# Patient Record
Sex: Female | Born: 1980 | Hispanic: Yes | Marital: Single | Smoking: Never smoker
Health system: Southern US, Community
[De-identification: ages and names within clinical notes are randomized; demographics above are authoritative.]

## PROBLEM LIST (undated history)

## (undated) ENCOUNTER — Inpatient Hospital Stay (HOSPITAL_COMMUNITY): Payer: Self-pay

## (undated) DIAGNOSIS — O24419 Gestational diabetes mellitus in pregnancy, unspecified control: Secondary | ICD-10-CM

## (undated) DIAGNOSIS — E039 Hypothyroidism, unspecified: Secondary | ICD-10-CM

## (undated) DIAGNOSIS — N189 Chronic kidney disease, unspecified: Secondary | ICD-10-CM

## (undated) DIAGNOSIS — O34219 Maternal care for unspecified type scar from previous cesarean delivery: Secondary | ICD-10-CM

## (undated) HISTORY — PX: OTHER SURGICAL HISTORY: SHX169

## (undated) HISTORY — DX: Maternal care for unspecified type scar from previous cesarean delivery: O34.219

---

## 2009-08-05 ENCOUNTER — Emergency Department (HOSPITAL_COMMUNITY): Admission: EM | Admit: 2009-08-05 | Discharge: 2009-08-05 | Payer: Self-pay | Admitting: Emergency Medicine

## 2009-08-08 ENCOUNTER — Ambulatory Visit: Payer: Self-pay | Admitting: Nurse Practitioner

## 2009-08-08 ENCOUNTER — Inpatient Hospital Stay (HOSPITAL_COMMUNITY): Admission: AD | Admit: 2009-08-08 | Discharge: 2009-08-08 | Payer: Self-pay | Admitting: Obstetrics & Gynecology

## 2009-08-23 ENCOUNTER — Ambulatory Visit: Payer: Self-pay | Admitting: Obstetrics and Gynecology

## 2009-08-28 ENCOUNTER — Ambulatory Visit (HOSPITAL_COMMUNITY): Admission: RE | Admit: 2009-08-28 | Discharge: 2009-08-28 | Payer: Self-pay | Admitting: Family Medicine

## 2010-03-10 ENCOUNTER — Encounter: Payer: Self-pay | Admitting: *Deleted

## 2010-04-30 ENCOUNTER — Other Ambulatory Visit: Payer: Self-pay | Admitting: Internal Medicine

## 2010-04-30 ENCOUNTER — Ambulatory Visit (HOSPITAL_COMMUNITY)
Admission: RE | Admit: 2010-04-30 | Discharge: 2010-04-30 | Disposition: A | Discharge status: Home / Self Care | Payer: Self-pay | Source: Ambulatory Visit | Attending: Internal Medicine | Admitting: Internal Medicine

## 2010-04-30 DIAGNOSIS — Z3201 Encounter for pregnancy test, result positive: Secondary | ICD-10-CM

## 2010-04-30 DIAGNOSIS — Z3689 Encounter for other specified antenatal screening: Secondary | ICD-10-CM | POA: Insufficient documentation

## 2010-04-30 DIAGNOSIS — O99891 Other specified diseases and conditions complicating pregnancy: Secondary | ICD-10-CM | POA: Insufficient documentation

## 2010-04-30 DIAGNOSIS — R102 Pelvic and perineal pain: Secondary | ICD-10-CM

## 2010-04-30 DIAGNOSIS — R1032 Left lower quadrant pain: Secondary | ICD-10-CM | POA: Insufficient documentation

## 2010-05-05 LAB — COMPREHENSIVE METABOLIC PANEL
ALT: 12 U/L (ref 0–35)
AST: 16 U/L (ref 0–37)
Albumin: 3.7 g/dL (ref 3.5–5.2)
Alkaline Phosphatase: 46 U/L (ref 39–117)
CO2: 23 mEq/L (ref 19–32)
Calcium: 9.1 mg/dL (ref 8.4–10.5)
Chloride: 110 mEq/L (ref 96–112)
GFR calc Af Amer: >60 mL/min (ref >60)
Glucose, Bld: 84 mg/dL (ref 70–99)
Total Bilirubin: 0.7 mg/dL (ref 0.3–1.2)
Total Protein: 6.7 g/dL (ref 6.0–8.3)

## 2010-05-05 LAB — POCT I-STAT, CHEM 8
BUN: 9 mg/dL (ref 6–23)
Chloride: 106 mEq/L (ref 96–112)
Glucose, Bld: 82 mg/dL (ref 70–99)
HCT: 41 % (ref 36.0–46.0)
Hemoglobin: 13.9 g/dL (ref 12.0–15.0)
Potassium: 3.7 mEq/L (ref 3.5–5.1)

## 2010-05-05 LAB — DIFFERENTIAL
Basophils Relative: 1 % (ref 0–1)
Eosinophils Relative: 1 % (ref 0–5)
Lymphocytes Relative: 32 % (ref 12–46)
Lymphs Abs: 1.6 10*3/uL (ref 0.7–4.0)
Neutrophils Relative %: 59 % (ref 43–77)

## 2010-05-05 LAB — CBC
Hemoglobin: 13.5 g/dL (ref 12.0–15.0)
MCHC: 34.6 g/dL (ref 30.0–36.0)
RDW: 14 % (ref 11.5–15.5)

## 2010-05-05 LAB — URINALYSIS, ROUTINE W REFLEX MICROSCOPIC
Glucose, UA: NEGATIVE mg/dL
Specific Gravity, Urine: 1.016 (ref 1.005–1.030)

## 2010-05-05 LAB — WET PREP, GENITAL
Trich, Wet Prep: NONE SEEN
Yeast Wet Prep HPF POC: NONE SEEN

## 2010-05-05 LAB — GC/CHLAMYDIA PROBE AMP, GENITAL: GC Probe Amp, Genital: NEGATIVE

## 2010-06-04 LAB — GC/CHLAMYDIA PROBE AMP, GENITAL
Chlamydia: NEGATIVE
Gonorrhea: NEGATIVE

## 2010-06-04 LAB — CBC
HCT: 39 % (ref 36–46)
Hemoglobin: 13.5 g/dL (ref 12.0–16.0)

## 2010-06-04 LAB — HIV ANTIBODY (ROUTINE TESTING W REFLEX): HIV: NONREACTIVE

## 2010-06-04 LAB — RPR: RPR: NONREACTIVE

## 2010-11-22 LAB — STREP B DNA PROBE: GBS: NEGATIVE

## 2010-12-23 ENCOUNTER — Other Ambulatory Visit: Payer: Self-pay | Admitting: Obstetrics

## 2010-12-23 ENCOUNTER — Encounter (HOSPITAL_COMMUNITY): Payer: Self-pay | Admitting: Pediatric Intensive Care

## 2010-12-23 ENCOUNTER — Encounter (HOSPITAL_COMMUNITY): Payer: Self-pay | Admitting: Anesthesiology

## 2010-12-23 ENCOUNTER — Encounter (HOSPITAL_COMMUNITY): Payer: Self-pay | Admitting: *Deleted

## 2010-12-23 ENCOUNTER — Inpatient Hospital Stay (HOSPITAL_COMMUNITY)
Admission: AD | Admit: 2010-12-23 | Discharge: 2010-12-26 | DRG: 766 | Disposition: A | Discharge status: Home / Self Care | Payer: Medicaid Other | Source: Ambulatory Visit | Attending: Obstetrics & Gynecology | Admitting: Obstetrics & Gynecology

## 2010-12-23 ENCOUNTER — Inpatient Hospital Stay (HOSPITAL_COMMUNITY): Payer: Medicaid Other | Admitting: Anesthesiology

## 2010-12-23 ENCOUNTER — Encounter (HOSPITAL_COMMUNITY)
Admission: AD | Disposition: A | Discharge status: Home / Self Care | Payer: Self-pay | Source: Ambulatory Visit | Attending: Obstetrics & Gynecology

## 2010-12-23 DIAGNOSIS — E079 Disorder of thyroid, unspecified: Secondary | ICD-10-CM | POA: Diagnosis present

## 2010-12-23 DIAGNOSIS — E039 Hypothyroidism, unspecified: Secondary | ICD-10-CM | POA: Diagnosis present

## 2010-12-23 DIAGNOSIS — O99280 Endocrine, nutritional and metabolic diseases complicating pregnancy, unspecified trimester: Secondary | ICD-10-CM | POA: Diagnosis present

## 2010-12-23 DIAGNOSIS — IMO0002 Reserved for concepts with insufficient information to code with codable children: Secondary | ICD-10-CM | POA: Clinically undetermined

## 2010-12-23 DIAGNOSIS — O4700 False labor before 37 completed weeks of gestation, unspecified trimester: Secondary | ICD-10-CM | POA: Diagnosis present

## 2010-12-23 DIAGNOSIS — O34219 Maternal care for unspecified type scar from previous cesarean delivery: Secondary | ICD-10-CM | POA: Diagnosis not present

## 2010-12-23 HISTORY — DX: Hypothyroidism, unspecified: E03.9

## 2010-12-23 HISTORY — DX: Chronic kidney disease, unspecified: N18.9

## 2010-12-23 LAB — CBC
HCT: 31.2 % — ABNORMAL LOW (ref 36.0–46.0)
Hemoglobin: 10.5 g/dL — ABNORMAL LOW (ref 12.0–15.0)
RBC: 3.89 MIL/uL (ref 3.87–5.11)
RDW: 14.2 % (ref 11.5–15.5)
WBC: 9 10*3/uL (ref 4.0–10.5)

## 2010-12-23 SURGERY — Surgical Case
Anesthesia: Epidural | Site: Abdomen | Wound class: Clean Contaminated

## 2010-12-23 MED ORDER — OXYTOCIN 20 UNITS IN LACTATED RINGERS INFUSION - SIMPLE: 125.0000 mL/h | Freq: Once | INTRAVENOUS | Status: DC

## 2010-12-23 MED ORDER — ACETAMINOPHEN 325 MG PO TABS: 325.0000 mg | ORAL_TABLET | ORAL | Status: DC | PRN

## 2010-12-23 MED ORDER — LACTATED RINGERS IV SOLN
INTRAVENOUS | Status: DC | PRN
  Administered 2010-12-23: via INTRAVENOUS

## 2010-12-23 MED ORDER — KETOROLAC TROMETHAMINE 30 MG/ML IJ SOLN
INTRAMUSCULAR | Status: AC
  Administered 2010-12-24: 30 mg via INTRAVENOUS
  Filled 2010-12-23: qty 1

## 2010-12-23 MED ORDER — MORPHINE SULFATE (PF) 0.5 MG/ML IJ SOLN
INTRAMUSCULAR | Status: DC | PRN
  Administered 2010-12-23: 1 mg via INTRAVENOUS

## 2010-12-23 MED ORDER — FENTANYL 2.5 MCG/ML BUPIVACAINE 1/10 % EPIDURAL INFUSION (WH - ANES)
INTRAMUSCULAR | Status: DC | PRN
  Administered 2010-12-23: 13 mL/h via EPIDURAL

## 2010-12-23 MED ORDER — FENTANYL 2.5 MCG/ML BUPIVACAINE 1/10 % EPIDURAL INFUSION (WH - ANES)
14.0000 mL/h | INTRAMUSCULAR | Status: DC
  Administered 2010-12-23: 14 mL/h via EPIDURAL
  Filled 2010-12-23 (×2): qty 60

## 2010-12-23 MED ORDER — CEFAZOLIN SODIUM 1-5 GM-% IV SOLN
INTRAVENOUS | Status: DC | PRN
  Administered 2010-12-23: 1 g via INTRAVENOUS

## 2010-12-23 MED ORDER — TERBUTALINE SULFATE 1 MG/ML IJ SOLN
INTRAMUSCULAR | Status: AC
  Filled 2010-12-23: qty 1

## 2010-12-23 MED ORDER — LIDOCAINE-EPINEPHRINE (PF) 2 %-1:200000 IJ SOLN
INTRAMUSCULAR | Status: AC
  Filled 2010-12-23: qty 20

## 2010-12-23 MED ORDER — FLEET ENEMA 7-19 GM/118ML RE ENEM: 1.0000 | ENEMA | RECTAL | Status: DC | PRN

## 2010-12-23 MED ORDER — LACTATED RINGERS IV SOLN: 500.0000 mL | INTRAVENOUS | Status: DC | PRN

## 2010-12-23 MED ORDER — LACTATED RINGERS IV SOLN: INTRAVENOUS | Status: DC

## 2010-12-23 MED ORDER — PHENYLEPHRINE 40 MCG/ML (10ML) SYRINGE FOR IV PUSH (FOR BLOOD PRESSURE SUPPORT)
80.0000 ug | PREFILLED_SYRINGE | INTRAVENOUS | Status: DC | PRN
  Filled 2010-12-23: qty 5

## 2010-12-23 MED ORDER — MEPERIDINE HCL 25 MG/ML IJ SOLN
INTRAMUSCULAR | Status: AC
  Filled 2010-12-23: qty 1

## 2010-12-23 MED ORDER — ONDANSETRON HCL 4 MG/2ML IJ SOLN
INTRAMUSCULAR | Status: AC
  Filled 2010-12-23: qty 2

## 2010-12-23 MED ORDER — ONDANSETRON HCL 4 MG/2ML IJ SOLN
INTRAMUSCULAR | Status: DC | PRN
  Administered 2010-12-23: 4 mg via INTRAVENOUS

## 2010-12-23 MED ORDER — MEPERIDINE HCL 25 MG/ML IJ SOLN: 6.2500 mg | INTRAMUSCULAR | Status: DC | PRN

## 2010-12-23 MED ORDER — MEPERIDINE HCL 25 MG/ML IJ SOLN
INTRAMUSCULAR | Status: DC | PRN
  Administered 2010-12-23: 6 mg via INTRAVENOUS
  Administered 2010-12-23: 7 mg via INTRAVENOUS
  Administered 2010-12-23 (×2): 6 mg via INTRAVENOUS

## 2010-12-23 MED ORDER — SODIUM BICARBONATE 8.4 % IV SOLN
INTRAVENOUS | Status: DC | PRN
  Administered 2010-12-23: 5 mL via EPIDURAL

## 2010-12-23 MED ORDER — OXYCODONE-ACETAMINOPHEN 5-325 MG PO TABS
2.0000 | ORAL_TABLET | ORAL | Status: DC | PRN
  Filled 2010-12-23: qty 1

## 2010-12-23 MED ORDER — LACTATED RINGERS IV SOLN
500.0000 mL | Freq: Once | INTRAVENOUS | Status: AC
  Administered 2010-12-23: 1000 mL via INTRAVENOUS

## 2010-12-23 MED ORDER — IBUPROFEN 600 MG PO TABS: 600.0000 mg | ORAL_TABLET | Freq: Four times a day (QID) | ORAL | Status: DC | PRN

## 2010-12-23 MED ORDER — EPHEDRINE 5 MG/ML INJ
10.0000 mg | INTRAVENOUS | Status: DC | PRN
  Filled 2010-12-23 (×2): qty 4

## 2010-12-23 MED ORDER — KETOROLAC TROMETHAMINE 30 MG/ML IJ SOLN: 15.0000 mg | Freq: Once | INTRAMUSCULAR | Status: AC | PRN

## 2010-12-23 MED ORDER — DIPHENHYDRAMINE HCL 50 MG/ML IJ SOLN: 12.5000 mg | INTRAMUSCULAR | Status: DC | PRN

## 2010-12-23 MED ORDER — LACTATED RINGERS IV SOLN
INTRAVENOUS | Status: DC
  Administered 2010-12-23 (×2): via INTRAVENOUS
  Administered 2010-12-23 (×2): 125 mL/h via INTRAVENOUS

## 2010-12-23 MED ORDER — LEVOTHYROXINE SODIUM 50 MCG PO TABS
50.0000 ug | ORAL_TABLET | Freq: Every day | ORAL | Status: DC
  Administered 2010-12-23: 50 ug via ORAL
  Filled 2010-12-23 (×2): qty 1

## 2010-12-23 MED ORDER — EPHEDRINE 5 MG/ML INJ
10.0000 mg | INTRAVENOUS | Status: DC | PRN
  Filled 2010-12-23: qty 4

## 2010-12-23 MED ORDER — OXYTOCIN 20 UNITS IN LACTATED RINGERS INFUSION - SIMPLE
INTRAVENOUS | Status: DC | PRN
  Administered 2010-12-23: 20 [IU] via INTRAVENOUS

## 2010-12-23 MED ORDER — OXYTOCIN 10 UNIT/ML IJ SOLN
INTRAMUSCULAR | Status: AC
  Filled 2010-12-23: qty 2

## 2010-12-23 MED ORDER — MORPHINE SULFATE 0.5 MG/ML IJ SOLN
INTRAMUSCULAR | Status: AC
  Filled 2010-12-23: qty 10

## 2010-12-23 MED ORDER — LIDOCAINE HCL 1.5 % IJ SOLN
INTRAMUSCULAR | Status: DC | PRN
  Administered 2010-12-23: 3 mL via EPIDURAL
  Administered 2010-12-23: 4 mL via INTRADERMAL

## 2010-12-23 MED ORDER — CITRIC ACID-SODIUM CITRATE 334-500 MG/5ML PO SOLN
30.0000 mL | ORAL | Status: DC | PRN
  Administered 2010-12-23: 30 mL via ORAL
  Filled 2010-12-23 (×2): qty 15

## 2010-12-23 MED ORDER — PHENYLEPHRINE 40 MCG/ML (10ML) SYRINGE FOR IV PUSH (FOR BLOOD PRESSURE SUPPORT)
80.0000 ug | PREFILLED_SYRINGE | INTRAVENOUS | Status: DC | PRN
  Filled 2010-12-23 (×2): qty 5

## 2010-12-23 MED ORDER — BUTORPHANOL TARTRATE 2 MG/ML IJ SOLN
1.0000 mg | INTRAMUSCULAR | Status: DC | PRN
  Administered 2010-12-23 (×2): 1 mg via INTRAVENOUS
  Filled 2010-12-23 (×3): qty 1

## 2010-12-23 MED ORDER — CEFAZOLIN SODIUM 1-5 GM-% IV SOLN
INTRAVENOUS | Status: AC
  Filled 2010-12-23: qty 50

## 2010-12-23 MED ORDER — ACETAMINOPHEN 325 MG PO TABS: 650.0000 mg | ORAL_TABLET | ORAL | Status: DC | PRN

## 2010-12-23 MED ORDER — MISOPROSTOL 25 MCG QUARTER TABLET
75.0000 ug | ORAL_TABLET | ORAL | Status: AC
  Administered 2010-12-23: 75 ug via ORAL
  Filled 2010-12-23: qty 0.25
  Filled 2010-12-23: qty 0.75

## 2010-12-23 MED ORDER — ONDANSETRON HCL 4 MG/2ML IJ SOLN: 4.0000 mg | Freq: Four times a day (QID) | INTRAMUSCULAR | Status: DC | PRN

## 2010-12-23 MED ORDER — PROMETHAZINE HCL 25 MG/ML IJ SOLN: 6.2500 mg | INTRAMUSCULAR | Status: DC | PRN

## 2010-12-23 MED ORDER — MORPHINE SULFATE (PF) 0.5 MG/ML IJ SOLN
INTRAMUSCULAR | Status: DC | PRN
  Administered 2010-12-23: 4 mg via EPIDURAL

## 2010-12-23 MED ORDER — SODIUM BICARBONATE 8.4 % IV SOLN
INTRAVENOUS | Status: AC
  Filled 2010-12-23: qty 50

## 2010-12-23 MED ORDER — OXYTOCIN BOLUS FROM INFUSION
500.0000 mL | Freq: Once | INTRAVENOUS | Status: DC
  Filled 2010-12-23: qty 1000
  Filled 2010-12-23: qty 500

## 2010-12-23 MED ORDER — FENTANYL CITRATE 0.05 MG/ML IJ SOLN
25.0000 ug | INTRAMUSCULAR | Status: DC | PRN
  Administered 2010-12-24 (×2): 50 ug via INTRAVENOUS

## 2010-12-23 MED ORDER — LIDOCAINE HCL (PF) 1 % IJ SOLN
30.0000 mL | INTRAMUSCULAR | Status: DC | PRN
  Filled 2010-12-23 (×2): qty 30

## 2010-12-23 SURGICAL SUPPLY — 32 items
CHLORAPREP W/TINT 26ML (MISCELLANEOUS) ×2 IMPLANT
CLOTH BEACON ORANGE TIMEOUT ST (SAFETY) ×2 IMPLANT
CONTAINER PREFILL 10% NBF 15ML (MISCELLANEOUS) ×4 IMPLANT
DERMABOND ADVANCED (GAUZE/BANDAGES/DRESSINGS) ×1
DERMABOND ADVANCED .7 DNX12 (GAUZE/BANDAGES/DRESSINGS) ×1 IMPLANT
DRESSING TELFA 8X3 (GAUZE/BANDAGES/DRESSINGS) IMPLANT
DRSG COVADERM 4X8 (GAUZE/BANDAGES/DRESSINGS) ×2 IMPLANT
ELECT REM PT RETURN 9FT ADLT (ELECTROSURGICAL) ×2
ELECTRODE REM PT RTRN 9FT ADLT (ELECTROSURGICAL) ×1 IMPLANT
EXTRACTOR VACUUM M CUP 4 TUBE (SUCTIONS) IMPLANT
GAUZE SPONGE 4X4 12PLY STRL LF (GAUZE/BANDAGES/DRESSINGS) ×4 IMPLANT
GLOVE BIO SURGEON STRL SZ8.5 (GLOVE) ×4 IMPLANT
GOWN PREVENTION PLUS LG XLONG (DISPOSABLE) ×4 IMPLANT
GOWN PREVENTION PLUS XXLARGE (GOWN DISPOSABLE) ×2 IMPLANT
KIT ABG SYR 3ML LUER SLIP (SYRINGE) IMPLANT
NEEDLE HYPO 25X5/8 SAFETYGLIDE (NEEDLE) ×2 IMPLANT
NS IRRIG 1000ML POUR BTL (IV SOLUTION) ×2 IMPLANT
PACK C SECTION WH (CUSTOM PROCEDURE TRAY) ×2 IMPLANT
PAD ABD 7.5X8 STRL (GAUZE/BANDAGES/DRESSINGS) IMPLANT
SLEEVE SCD COMPRESS KNEE MED (MISCELLANEOUS) IMPLANT
SUT CHROMIC 0 CT 802H (SUTURE) ×2 IMPLANT
SUT CHROMIC 1 CTX 36 (SUTURE) ×6 IMPLANT
SUT CHROMIC 2 0 SH (SUTURE) ×2 IMPLANT
SUT GUT PLAIN 0 CT-3 TAN 27 (SUTURE) ×2 IMPLANT
SUT MON AB 4-0 PS1 27 (SUTURE) ×2 IMPLANT
SUT VIC AB 0 CT1 18XCR BRD8 (SUTURE) IMPLANT
SUT VIC AB 0 CT1 8-18 (SUTURE)
SUT VIC AB 0 CTX 36 (SUTURE) ×2
SUT VIC AB 0 CTX36XBRD ANBCTRL (SUTURE) ×2 IMPLANT
TOWEL OR 17X24 6PK STRL BLUE (TOWEL DISPOSABLE) ×4 IMPLANT
TRAY FOLEY CATH 14FR (SET/KITS/TRAYS/PACK) ×2 IMPLANT
WATER STERILE IRR 1000ML POUR (IV SOLUTION) ×2 IMPLANT

## 2010-12-23 NOTE — Progress Notes (Signed)
Started yesterday evening. Getting closer and stronger, q 6-35min.  No bleeding or water leaking.

## 2010-12-23 NOTE — Anesthesia Procedure Notes (Signed)
Epidural Patient location during procedure: OB Start time: 12/23/2010 6:26 PM  Staffing Anesthesiologist: Demarrion Meiklejohn A. Performed by: anesthesiologist   Preanesthetic Checklist Completed: patient identified, site marked, surgical consent, pre-op evaluation, timeout performed, IV checked, risks and benefits discussed and monitors and equipment checked  Epidural Patient position: sitting Prep: site prepped and draped and DuraPrep Patient monitoring: continuous pulse ox and blood pressure Approach: midline Injection technique: LOR air  Needle:  Needle type: Tuohy  Needle gauge: 17 G Needle length: 9 cm Needle insertion depth: 5 cm cm Catheter type: closed end flexible Catheter size: 19 Gauge Catheter at skin depth: 10 cm Test dose: negative and 1.5% lidocaine  Assessment Events: blood not aspirated, injection not painful, no injection resistance, negative IV test and no paresthesia  Additional Notes Patient is more comfortable after epidural dosed. Please see RN's note for documentation of vital signs and FHR which are stable.

## 2010-12-23 NOTE — Progress Notes (Signed)
[facility] Department of Clinical Social Work Documentation of Interpretation   I assisted ___Anita RN ________________ with interpretation of __questions and plan for pain control____________________ for this patient.

## 2010-12-23 NOTE — Progress Notes (Signed)
Spanish interpreter at bedside, explained plan of care with pt and FOB, questions answered, pt and FOB verbalized understanding.

## 2010-12-23 NOTE — Progress Notes (Signed)
MD made aware of pt status. Duration of pushing time, station, SVE, FHT. Will be in to evaluate pt.

## 2010-12-23 NOTE — Op Note (Signed)
Preop diagnosis failure to progress in labor nonreassuring fetal heart rate tracing postop diagnosis is same Surgeon Dr. Gaynell Face  Patient placed on the operating table in the supine position after the epidural was dosed will abdomen prepped and draped in the bladder emptied with a Foley catheter a Transverse suprapubic incision made carried down to the rectus fascia fascia cleaned and incised the length of the incision recti muscles retracted laterally peritoneum incised longitudinally transverse incision made on the visceroperitoneum above the bladder and the bladder mobilized inferiorly A transverse low uterine incision made in the patient delivered from the OP position of a female Apgar 89 weighing 6 lbs. 7 oz. The placenta was present posterior removed manually and sent to pathology uterine cavity clean and dry laps Uterine incision closed in one layer with continuous within normal on chromic Bladder flap reattached to a chromic Lap and sponge counts correct abdomen closed in layers peritoneum continuous with 2-0 chromic Fascia closed with continuous within of 0 Dexon and the skin shows a subcuticular stitch of 4-0 Monocryl blood loss was 600 cc patient tolerated the procedure well and the dictation dictated by Dr. Gaynell Face

## 2010-12-23 NOTE — Progress Notes (Signed)
Csection planned. POC discussed by Dr Gaynell Face

## 2010-12-23 NOTE — Progress Notes (Signed)
[facility] Department of Clinical Social Work Documentation of Interpretation   I assisted _Susan RN__________________ with interpretation of ___questions___________________ for this patient.

## 2010-12-23 NOTE — Progress Notes (Signed)
Pt delivered viable female with APGARS 8, 9 primary cesarean section by DR Gaynell Face.

## 2010-12-23 NOTE — Progress Notes (Signed)
Joanne [name] is a 30 y.o. G1P0 at [redacted]w[redacted]d by LMP admitted for active labor  Subjective: Uncomfortable  Objective: BP 111/52  Pulse 61  Temp(Src) 97.9 F (36.6 C) (Axillary)  Resp 20  Ht 5' 0.5" (1.537 m)  Wt 63.504 kg (140 lb)  BMI 26.89 kg/m2      FHT:  FHR: 140 bpm, variability: moderate,  accelerations:  Present,  decelerations:  Absent UC:   irregular, every 4 minutes SVE:   Dilation: 4 Effacement (%): 90 Station: 0 Exam by:: Jackson-Moore AROM, IUPC placed  Labs: Lab Results  Component Value Date   WBC 9.0 12/23/2010   HGB 10.5* 12/23/2010   HCT 31.2* 12/23/2010   MCV 80.2 12/23/2010   PLT 258 12/23/2010    Assessment / Plan: Latent labor  Labor: augment with oral cytotec Preeclampsia:  N/A Fetal Wellbeing:  Category I Pain Control:  Labor support without medications I/D:  n/a Anticipated MOD:  NSVD  JACKSON-MOORE,Chanan Detwiler A 12/23/2010, 3:24 PM

## 2010-12-23 NOTE — H&P (Signed)
Joanne [name] is [name] 30 y.o. female presenting for contractions. Maternal Medical History:  Reason for admission: Reason for admission: contractions.  Contractions: Frequency: regular.   Perceived severity is strong.    Fetal activity: Perceived fetal activity is normal.    Prenatal complications: Hypothyroidism    OB History    Grav Para Term Preterm Abortions TAB SAB Ect Mult Living   1              Past Medical History  Diagnosis Date  . Chronic kidney disease     Pt has only one kidney, pt. donated [name] kidney. Pt. not having any problems  . Hypothyroid    Past Surgical History  Procedure Date  . Donated kidney   . Tonsillectomy    Family History: family history is not on file. Social History:  reports that she has never smoked. She has never used smokeless tobacco. She reports that she does not drink alcohol or use illicit drugs.  Review of Systems  Constitutional: Negative for fever.  Eyes: Negative for blurred vision.  Respiratory: Negative for shortness of breath.   Gastrointestinal: Negative for vomiting.  Skin: Negative for rash.  Neurological: Negative for headaches.    Dilation: 3 Effacement (%): 80 Station: -1 Exam by:: S. Carrera, RNC Blood pressure 89/66, pulse 67, temperature 97.9 F (36.6 C), temperature source Axillary, resp. rate 20, height 5' 0.5" (1.537 m), weight 63.504 kg (140 lb). Maternal Exam:  Uterine Assessment: Contraction strength is firm.  Contraction frequency is regular.   Abdomen: Fetal presentation: vertex  Introitus: not evaluated.     Fetal Exam Fetal Monitor Review: Variability: moderate (6-25 bpm).   Pattern: accelerations present and no decelerations.    Fetal State Assessment: Category I - tracings are normal.     Physical Exam  Constitutional: She appears well-developed.  HENT:  Head: Normocephalic.  Neck: Neck supple. No thyromegaly present.  Cardiovascular: Normal rate and regular rhythm.   Respiratory: Breath  sounds normal.  GI: Soft. Bowel sounds are normal.  Skin: No rash noted.    Prenatal labs: ABO, Rh: O/Positive/-- (04/17 0000) Antibody: Negative (04/17 0000) Rubella: Immune (04/17 0000) RPR: Nonreactive (04/17 0000)  HBsAg: Negative (04/17 0000)  HIV: Non-reactive (04/17 0000)  GBS: Negative, Negative, Negative (10/31 0000)   Assessment/Plan: 30 y.o. with an IUP @ [redacted]w[redacted]d in early labor.  Admit Monitor progress Anticipate NSVD   JACKSON-MOORE,Joanne [name] 12/23/2010, 1:38 PM

## 2010-12-23 NOTE — Progress Notes (Signed)
  Patient reported is being fully dilated and pushing for the past  hour brings it down to plus one station with molding and  with each contraction she has deep variables and late decelerations variability still good and it was decided she delivered by C-section because of nonreassuring fetal heart rate tracing and you to progress in labor and

## 2010-12-23 NOTE — Anesthesia Preprocedure Evaluation (Signed)
Anesthesia Evaluation  Patient identified by MRN, date of birth, ID band Patient awake    Reviewed: Allergy & Precautions, H&P , Patient's Chart, lab work & pertinent test results  Airway Mallampati: III TM Distance: >3 FB Neck ROM: full    Dental No notable dental hx. (+) Teeth Intact   Pulmonary neg pulmonary ROS,  clear to auscultation  Pulmonary exam normal       Cardiovascular neg cardio ROS regular Normal    Neuro/Psych Negative Neurological ROS  Negative Psych ROS   GI/Hepatic negative GI ROS, Neg liver ROS,   Endo/Other  Negative Endocrine ROS  Renal/GU negative Renal ROSSolitary Kidney  Genitourinary negative   Musculoskeletal   Abdominal   Peds  Hematology negative hematology ROS (+)   Anesthesia Other Findings   Reproductive/Obstetrics (+) Pregnancy                           Anesthesia Physical Anesthesia Plan  ASA: II  Anesthesia Plan: Epidural   Post-op Pain Management:    Induction:   Airway Management Planned:   Additional Equipment:   Intra-op Plan:   Post-operative Plan:   Informed Consent: I have reviewed the patients History and Physical, chart, labs and discussed the procedure including the risks, benefits and alternatives for the proposed anesthesia with the patient or authorized representative who has indicated his/her understanding and acceptance.     Plan Discussed with: Anesthesiologist and Surgeon  Anesthesia Plan Comments:         Anesthesia Quick Evaluation

## 2010-12-24 ENCOUNTER — Encounter (HOSPITAL_COMMUNITY): Payer: Self-pay | Admitting: *Deleted

## 2010-12-24 DIAGNOSIS — O34219 Maternal care for unspecified type scar from previous cesarean delivery: Secondary | ICD-10-CM | POA: Diagnosis not present

## 2010-12-24 LAB — CBC
MCV: 80.3 fL (ref 78.0–100.0)
Platelets: 203 10*3/uL (ref 150–400)
RBC: 3.2 MIL/uL — ABNORMAL LOW (ref 3.87–5.11)
RDW: 14.2 % (ref 11.5–15.5)
WBC: 13.9 10*3/uL — ABNORMAL HIGH (ref 4.0–10.5)

## 2010-12-24 MED ORDER — LANOLIN HYDROUS EX OINT: 1.0000 "application " | TOPICAL_OINTMENT | CUTANEOUS | Status: DC | PRN

## 2010-12-24 MED ORDER — PRENATAL PLUS 27-1 MG PO TABS
1.0000 | ORAL_TABLET | Freq: Every day | ORAL | Status: DC
  Administered 2010-12-24 – 2010-12-26 (×3): 1 via ORAL
  Filled 2010-12-24 (×3): qty 1

## 2010-12-24 MED ORDER — LEVOTHYROXINE SODIUM 50 MCG PO TABS
50.0000 ug | ORAL_TABLET | Freq: Every day | ORAL | Status: DC
  Administered 2010-12-24 – 2010-12-26 (×3): 50 ug via ORAL
  Filled 2010-12-24 (×3): qty 1

## 2010-12-24 MED ORDER — ONDANSETRON HCL 4 MG PO TABS: 4.0000 mg | ORAL_TABLET | ORAL | Status: DC | PRN

## 2010-12-24 MED ORDER — FENTANYL CITRATE 0.05 MG/ML IJ SOLN
INTRAMUSCULAR | Status: AC
  Administered 2010-12-24: 50 ug via INTRAVENOUS
  Filled 2010-12-24: qty 2

## 2010-12-24 MED ORDER — METOCLOPRAMIDE HCL 5 MG/ML IJ SOLN: 10.0000 mg | Freq: Three times a day (TID) | INTRAMUSCULAR | Status: DC | PRN

## 2010-12-24 MED ORDER — DIBUCAINE 1 % RE OINT: 1.0000 "application " | TOPICAL_OINTMENT | RECTAL | Status: DC | PRN

## 2010-12-24 MED ORDER — IBUPROFEN 600 MG PO TABS
600.0000 mg | ORAL_TABLET | Freq: Four times a day (QID) | ORAL | Status: DC
  Administered 2010-12-24 – 2010-12-26 (×9): 600 mg via ORAL
  Filled 2010-12-24 (×5): qty 1

## 2010-12-24 MED ORDER — DIPHENHYDRAMINE HCL 50 MG/ML IJ SOLN: 12.5000 mg | INTRAMUSCULAR | Status: DC | PRN

## 2010-12-24 MED ORDER — OXYCODONE-ACETAMINOPHEN 5-325 MG PO TABS
1.0000 | ORAL_TABLET | ORAL | Status: DC | PRN
  Administered 2010-12-25 – 2010-12-26 (×3): 1 via ORAL
  Filled 2010-12-24 (×2): qty 1

## 2010-12-24 MED ORDER — LACTATED RINGERS IV SOLN
INTRAVENOUS | Status: DC
  Administered 2010-12-24: 10:00:00 via INTRAVENOUS

## 2010-12-24 MED ORDER — WITCH HAZEL-GLYCERIN EX PADS: 1.0000 "application " | MEDICATED_PAD | CUTANEOUS | Status: DC | PRN

## 2010-12-24 MED ORDER — ONDANSETRON HCL 4 MG/2ML IJ SOLN: 4.0000 mg | INTRAMUSCULAR | Status: DC | PRN

## 2010-12-24 MED ORDER — MENTHOL 3 MG MT LOZG
1.0000 | LOZENGE | OROMUCOSAL | Status: DC | PRN
  Administered 2010-12-24: 3 mg via ORAL
  Filled 2010-12-24: qty 9

## 2010-12-24 MED ORDER — NALBUPHINE HCL 10 MG/ML IJ SOLN
5.0000 mg | INTRAMUSCULAR | Status: DC | PRN
  Filled 2010-12-24: qty 1

## 2010-12-24 MED ORDER — SCOPOLAMINE 1 MG/3DAYS TD PT72
MEDICATED_PATCH | TRANSDERMAL | Status: AC
  Administered 2010-12-24: 1.5 mg via TRANSDERMAL
  Filled 2010-12-24: qty 1

## 2010-12-24 MED ORDER — DIPHENHYDRAMINE HCL 25 MG PO CAPS: 25.0000 mg | ORAL_CAPSULE | Freq: Four times a day (QID) | ORAL | Status: DC | PRN

## 2010-12-24 MED ORDER — SIMETHICONE 80 MG PO CHEW: 80.0000 mg | CHEWABLE_TABLET | ORAL | Status: DC | PRN

## 2010-12-24 MED ORDER — ONDANSETRON HCL 4 MG/2ML IJ SOLN: 4.0000 mg | Freq: Three times a day (TID) | INTRAMUSCULAR | Status: DC | PRN

## 2010-12-24 MED ORDER — NALOXONE HCL 0.4 MG/ML IJ SOLN: 0.4000 mg | INTRAMUSCULAR | Status: DC | PRN

## 2010-12-24 MED ORDER — SODIUM CHLORIDE 0.9 % IV SOLN
1.0000 ug/kg/h | INTRAVENOUS | Status: DC | PRN
  Filled 2010-12-24: qty 2.5

## 2010-12-24 MED ORDER — IBUPROFEN 600 MG PO TABS
600.0000 mg | ORAL_TABLET | Freq: Four times a day (QID) | ORAL | Status: DC | PRN
  Filled 2010-12-24 (×4): qty 1

## 2010-12-24 MED ORDER — SCOPOLAMINE 1 MG/3DAYS TD PT72
1.0000 | MEDICATED_PATCH | Freq: Once | TRANSDERMAL | Status: DC
  Filled 2010-12-24: qty 1

## 2010-12-24 MED ORDER — ZOLPIDEM TARTRATE 5 MG PO TABS: 5.0000 mg | ORAL_TABLET | Freq: Every evening | ORAL | Status: DC | PRN

## 2010-12-24 MED ORDER — SODIUM CHLORIDE 0.9 % IJ SOLN: 3.0000 mL | INTRAMUSCULAR | Status: DC | PRN

## 2010-12-24 MED ORDER — DIPHENHYDRAMINE HCL 25 MG PO CAPS: 25.0000 mg | ORAL_CAPSULE | ORAL | Status: DC | PRN

## 2010-12-24 MED ORDER — OXYTOCIN 20 UNITS IN LACTATED RINGERS INFUSION - SIMPLE
125.0000 mL/h | INTRAVENOUS | Status: AC
  Administered 2010-12-24: 125 mL/h via INTRAVENOUS
  Filled 2010-12-24 (×2): qty 1000

## 2010-12-24 MED ORDER — SIMETHICONE 80 MG PO CHEW
80.0000 mg | CHEWABLE_TABLET | Freq: Three times a day (TID) | ORAL | Status: DC
  Administered 2010-12-24 – 2010-12-26 (×5): 80 mg via ORAL

## 2010-12-24 MED ORDER — TETANUS-DIPHTH-ACELL PERTUSSIS 5-2.5-18.5 LF-MCG/0.5 IM SUSP
0.5000 mL | Freq: Once | INTRAMUSCULAR | Status: AC
  Administered 2010-12-26: 0.5 mL via INTRAMUSCULAR
  Filled 2010-12-24: qty 0.5

## 2010-12-24 MED ORDER — DIPHENHYDRAMINE HCL 50 MG/ML IJ SOLN: 25.0000 mg | INTRAMUSCULAR | Status: DC | PRN

## 2010-12-24 MED ORDER — ACETAMINOPHEN 10 MG/ML IV SOLN
1000.0000 mg | Freq: Four times a day (QID) | INTRAVENOUS | Status: AC | PRN
  Filled 2010-12-24: qty 100

## 2010-12-24 MED ORDER — SENNOSIDES-DOCUSATE SODIUM 8.6-50 MG PO TABS
2.0000 | ORAL_TABLET | Freq: Every day | ORAL | Status: DC
  Administered 2010-12-24 – 2010-12-25 (×2): 2 via ORAL

## 2010-12-24 NOTE — Progress Notes (Signed)
UR Chart review completed.  

## 2010-12-24 NOTE — Anesthesia Postprocedure Evaluation (Signed)
Anesthesia Post Note  Patient: Joanne [name]  Procedure(s) Performed:  CESAREAN SECTION  Anesthesia type: Epidural  Patient location: pacu  Post pain: Pain level controlled  Post assessment: Post-op Vital signs reviewed  Last Vitals:  Filed Vitals:   12/23/10 2230  BP: 119/105  Pulse: 70  Temp:   Resp: 20    Post vital signs: Reviewed  Level of consciousness: awake  Complications: No apparent anesthesia complications

## 2010-12-24 NOTE — Transfer of Care (Signed)
Immediate Anesthesia Transfer of Care Note  Patient: Joanne [name]  Procedure(s) Performed:  CESAREAN SECTION  Patient Location: PACU  Anesthesia Type: Epidural  Level of Consciousness: awake, alert , oriented and patient cooperative  Airway & Oxygen Therapy: Patient Spontanous Breathing  Post-op Assessment: Report given to PACU RN  Post vital signs: Reviewed and stable  Complications: No apparent anesthesia complications

## 2010-12-24 NOTE — Progress Notes (Signed)
[facility] Department of Clinical Social Work Documentation of Interpretation   I assisted ____Stephanie RN_______________ with interpretation of _____questions_________________ for this patient.

## 2010-12-24 NOTE — Addendum Note (Signed)
Addendum  created 12/24/10 2239 by Keymoni Mccaster L. Rodman Pickle, MD   Modules edited:PRL Based Order Sets

## 2010-12-24 NOTE — Anesthesia Postprocedure Evaluation (Signed)
  Anesthesia Post-op Note  Patient: Joanne [name]  Procedure(s) Performed:  CESAREAN SECTION  Patient Location: Mother/Baby  Anesthesia Type: Epidural  Level of Consciousness: awake, alert  and oriented  Airway and Oxygen Therapy: Patient Spontanous Breathing  Post-op Pain: none  Post-op Assessment: Post-op Vital signs reviewed, Patient's Cardiovascular Status Stable, Respiratory Function Stable, Patent Airway, No signs of Nausea or vomiting and Adequate PO intake  Post-op Vital Signs: Reviewed and stable  Complications: No apparent anesthesia complications

## 2010-12-24 NOTE — Addendum Note (Signed)
Addendum  created 12/24/10 0831 by Kasin Tonkinson Adedayo Alleigh Mollica, CRNA   Modules edited:Notes Section    

## 2010-12-24 NOTE — Addendum Note (Signed)
Addendum  created 12/24/10 2239 by Marianny Goris L. Isamu Trammel, MD   Modules edited:PRL Based Order Sets    

## 2010-12-24 NOTE — Progress Notes (Signed)
Subjective: POD# 1 s/p Cesarean Delivery.  Indications: failure to progress  RH status/Rubella reviewed. Feeding: breast Patient reports tolerating PO.  Denies HA/SOB/C/P/N/V/dizziness.  Reports flatus or BM. Breast symptoms: no.  She reports vaginal bleeding as normal, without clots.  She is ambulating, urinating without difficulty.     Objective: Vital signs in last 24 hours: BP 107/68  Pulse 65  Temp(Src) 98.1 F (36.7 C) (Oral)  Resp 16  Ht 5' 0.5" (1.537 m)  Wt 63.504 kg (140 lb)  BMI 26.89 kg/m2  SpO2 98%  Breastfeeding? Unknown       Physical Exam:  General: alert CV: Regular rate and rhythm Resp: clear Abdomen: soft, nontender, normal bowel sounds Lochia: minimal Uterine Fundus: firm, below umbilicus, nontender Incision: clean, dry and intact Ext: extremities normal, atraumatic, no cyanosis or edema    Basename 12/24/10 0535 12/23/10 1013  HGB 8.7* 10.5*  HCT 25.7* 31.2*      Assessment/Plan: 30 y.o.  status post Cesarean section. POD# 1.   Doing well, stable.              Advance diet as tolerated Start po pain meds D/C foley  HLIV  Ambulate IS Routine post-op care  JACKSON-MOORE,Chizara Mena A 12/24/2010, 9:51 AM

## 2010-12-24 NOTE — Addendum Note (Signed)
Addendum  created 12/24/10 0831 by Blythe Stanford, CRNA   Modules edited:Notes Section

## 2010-12-25 DIAGNOSIS — IMO0002 Reserved for concepts with insufficient information to code with codable children: Secondary | ICD-10-CM | POA: Clinically undetermined

## 2010-12-25 NOTE — Progress Notes (Signed)
Subjective: POD# 2 s/p Cesarean Delivery.  Indications: failure to progress  RH status/Rubella reviewed. Feeding: breast Patient reports tolerating PO.  Denies HA/SOB/C/P/N/V/dizziness.  Reports flatus or BM. Breast symptoms: no.  She reports vaginal bleeding as normal, without clots.  She is ambulating, urinating without difficulty.     Objective: Vital signs in last 24 hours: BP 99/61  Pulse 97  Temp(Src) 98.7 F (37.1 C) (Oral)  Resp 18  Ht 5' 0.5" (1.537 m)  Wt 63.504 kg (140 lb)  BMI 26.89 kg/m2  SpO2 97%  Breastfeeding? Unknown       Physical Exam:  General: alert CV: Regular rate and rhythm Resp: clear Abdomen: soft, nontender, normal bowel sounds Lochia: minimal Uterine Fundus: firm, below umbilicus, nontender Incision: clean, dry and intact Ext: extremities normal, atraumatic, no cyanosis or edema    Basename 12/24/10 0535 12/23/10 1013  HGB 8.7* 10.5*  HCT 25.7* 31.2*      Assessment/Plan: 30 y.o.  status post Cesarean section. POD# 2.   Doing well, stable.               Ambulate IS Routine post-op care  JACKSON-MOORE,Holy [place] A 12/25/2010, 3:55 PM

## 2010-12-26 MED ORDER — FERROUS SULFATE 325 (65 FE) MG PO TABS: 325.0000 mg | ORAL_TABLET | Freq: Two times a day (BID) | ORAL | Status: DC

## 2010-12-26 MED ORDER — OXYCODONE-ACETAMINOPHEN 5-325 MG PO TABS: 1.0000 | ORAL_TABLET | ORAL | Status: AC | PRN

## 2010-12-26 MED ORDER — NORETHINDRONE 0.35 MG PO TABS: 1.0000 | ORAL_TABLET | Freq: Every day | ORAL | Status: DC

## 2010-12-26 MED ORDER — INFLUENZA VIRUS VACC SPLIT PF IM SUSP
0.5000 mL | INTRAMUSCULAR | Status: AC | PRN
  Administered 2010-12-26: 0.5 mL via INTRAMUSCULAR
  Filled 2010-12-26: qty 0.5

## 2010-12-26 MED ORDER — IBUPROFEN 600 MG PO TABS: 600.0000 mg | ORAL_TABLET | Freq: Four times a day (QID) | ORAL | Status: AC | PRN

## 2010-12-26 NOTE — Progress Notes (Signed)
Subjective: POD #3 s/p LTC/S  Indication:failure to progress Patient reports tolerating PO.  Denies HA/SOB/C/P/N/V/dizziness.  Reports flatus or BM. Breast symptoms: no  She reports vaginal bleeding as normal, without clots.  She is ambulating, urinating without difficulty.     Objective: Vital signs in last 24 hours: BP 94/56  Pulse 98  Temp(Src) 97.9 F (36.6 C) (Oral)  Resp 18  Ht 5' 0.5" (1.537 m)  Wt 63.504 kg (140 lb)  BMI 26.89 kg/m2  SpO2 97%  Breastfeeding? Unknown  Physical Exam:  General: alert CV: Regular rate and rhythm Resp: clear Abdomen: soft, nontender, normal bowel sounds Lochia: minimal Uterine Fundus: firm, below umbilicus, tender Incision: clean, dry and intact Ext: extremities normal, atraumatic, no cyanosis or edema  Basename 12/24/10 0535 12/23/10 1013  HGB 8.7* 10.5*  HCT 25.7* 31.2*    Assessment/Plan: 30 y.o. status post Cesarean section POD# 3.  normal post-operative exam patient is a candidate for oral progesterone-only contraceptive for contraception, with no contraindications  Routine post-op care D/C home  [name],Joanne Geraci A 12/26/2010, 5:58 AM

## 2010-12-26 NOTE — Discharge Summary (Signed)
Obstetric Discharge Summary Reason for Admission: induction of labor Prenatal Procedures: none Intrapartum Procedures: cesarean: low cervical, transverse Postpartum Procedures: none Complications-Operative and Postpartum: none Hemoglobin  Date Value Range Status  12/24/2010 8.7* 12.0-15.0 (g/dL) Final     DELTA CHECK NOTED     REPEATED TO VERIFY     HCT  Date Value Range Status  12/24/2010 25.7* 36.0-46.0 (%) Final    Discharge Diagnoses: Term Pregnancy-delivered  Discharge Information: Date: 12/26/2010 Activity: pelvic rest Diet: routine Medications: PNV, Ibuprofen, Iron, Percocet and Micronor Condition: stable Instructions: See above Discharge to: home Follow-up Information    Follow up with Antionette Char A, MD. Call in 2 weeks.   Contact information:   [address] [place] [address] [phone]          Newborn Data: Live born female  Birth Weight: 6 lb 7.7 oz (2940 g) APGAR: 8, 9  Home with mother.  JACKSON-MOORE,Airabella Barley A 12/26/2010, 6:04 AM

## 2010-12-26 NOTE — Progress Notes (Signed)
[facility] Department of Clinical Social Work Documentation of Interpretation   I assisted __Olivia RN_________________ with interpretation of __discharge____________________ for this patient.

## 2010-12-29 ENCOUNTER — Inpatient Hospital Stay (HOSPITAL_COMMUNITY): Admission: RE | Admit: 2010-12-29 | Payer: Self-pay | Source: Ambulatory Visit

## 2011-07-16 IMAGING — CT CT ABD-PELV W/O CM
1 of 4 series · 8 of 32 positions shown, 13 images · non-contrast
Comparison: Pelvic ultrasound 08/05/2009.

CLINICAL DATA: Left flank pain for 2 days with nausea and vomiting.
History of right renal transplant.

CT ABDOMEN AND PELVIS WITHOUT CONTRAST
TECHNIQUE: Multidetector CT imaging of the abdomen and pelvis was
performed following the standard protocol without intravenous
contrast.

[Series 2: stone <(id) >(id) · axial · 0.70mm/px · z∈[-367,-47]mm · 8 of 84 slices shown, 13 images]
[im 10/84  soft-tissue]
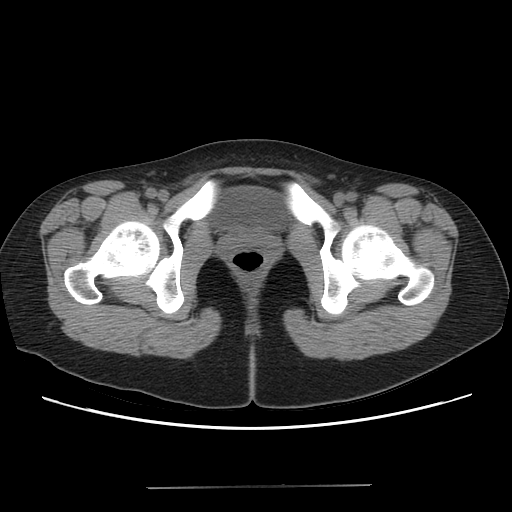
[im 10/84  bone]
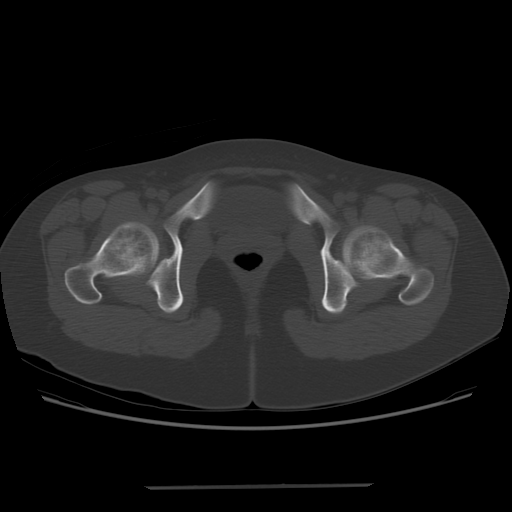
[im 19/84  soft-tissue]
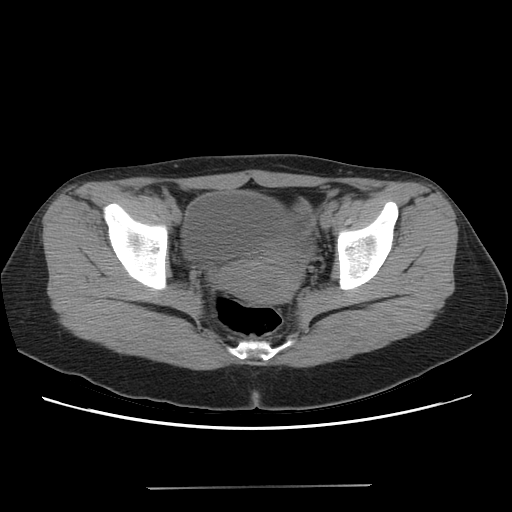
[im 28/84  soft-tissue]
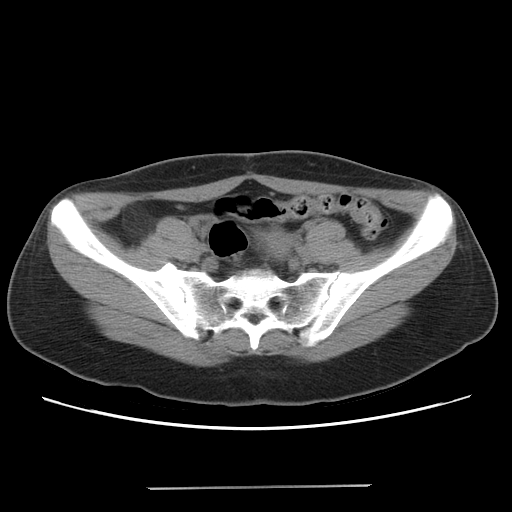
[im 37/84  soft-tissue]
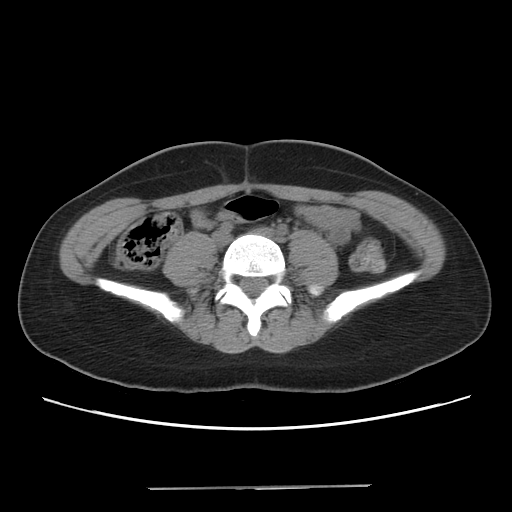
[im 47/84  soft-tissue]
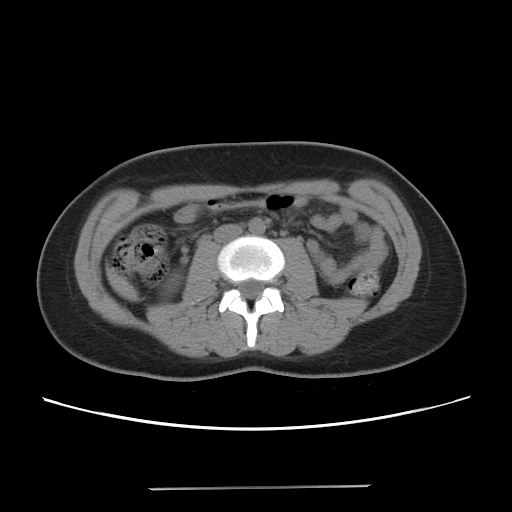
[im 47/84  lung]
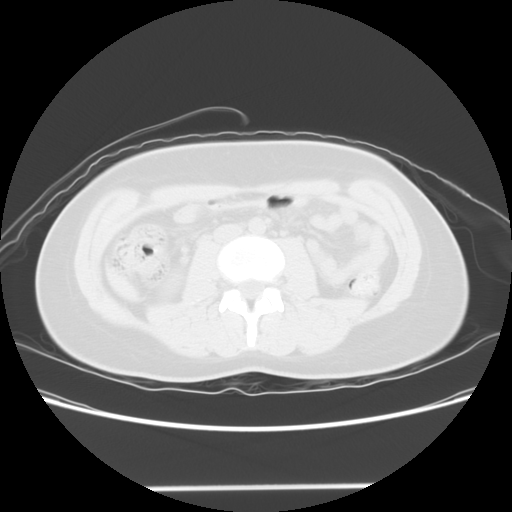
[im 56/84  soft-tissue]
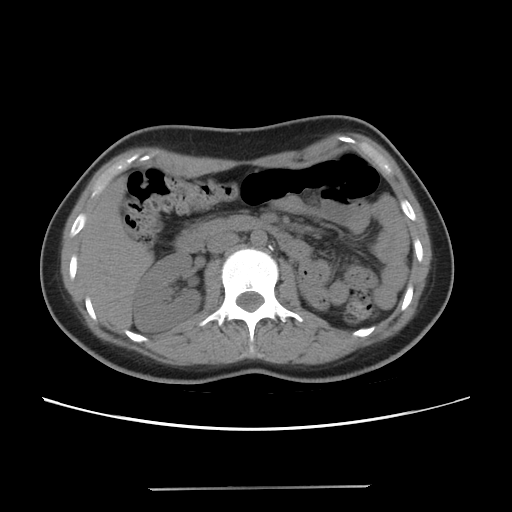
[im 56/84  lung]
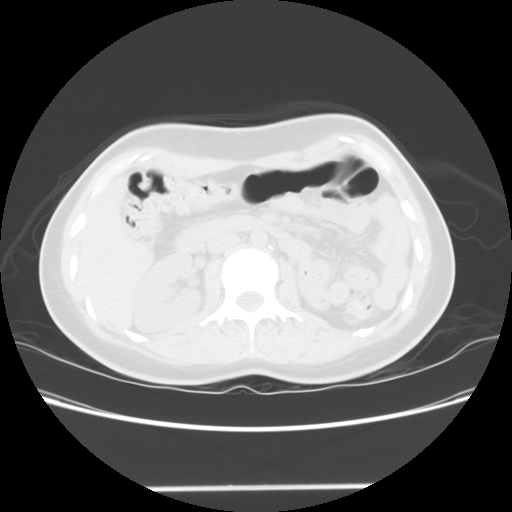
[im 65/84  soft-tissue]
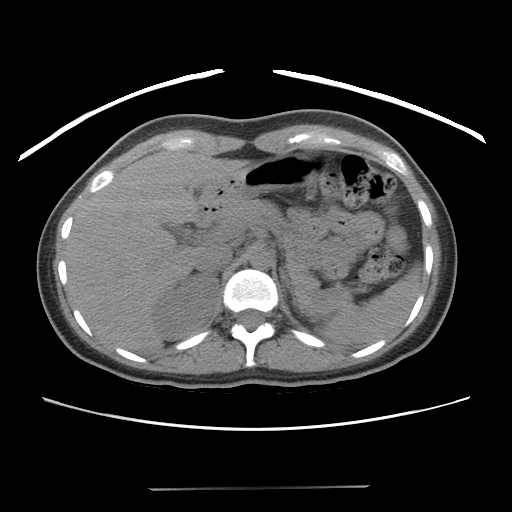
[im 65/84  lung]
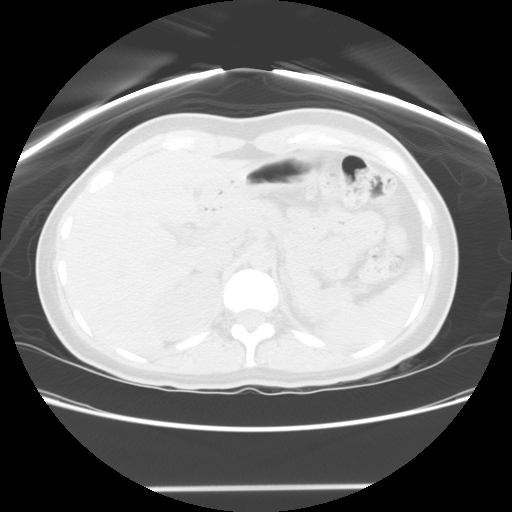
[im 74/84  soft-tissue]
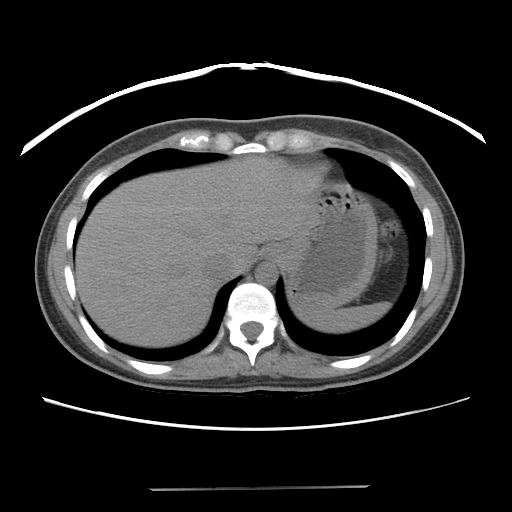
[im 74/84  lung]
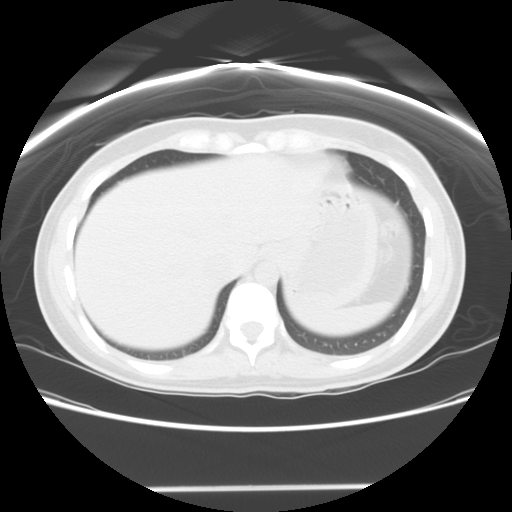

[8 of 32 positions shown; findings below may reference images not displayed]

FINDINGS: The lung bases are clear.  There is no pleural effusion.
As imaged in the noncontrast state, the liver, spleen, gallbladder,
pancreas and adrenal glands appear normal.

The right kidney is located in a normal position and appears
normal.  No left kidney is identified.  There are calcifications or
clips in the left periaortic region suggesting prior left
nephrectomy.  No renal transplant is identified.  There are no
postsurgical changes in the pelvis.

The bowel gas pattern is normal.  The appendix appears normal.  The
uterus and ovaries appear unremarkable by CT.
IMPRESSION: 1.  No acute abdominal pelvic findings.
2.  CT findings suggest prior left nephrectomy and no evidence of
prior renal transplant.  Correlate clinically.

## 2012-05-12 ENCOUNTER — Emergency Department (HOSPITAL_COMMUNITY)
Admission: EM | Admit: 2012-05-12 | Discharge: 2012-05-12 | Disposition: A | Discharge status: Home / Self Care | Payer: No Typology Code available for payment source | Source: Home / Self Care

## 2012-05-12 DIAGNOSIS — Z524 Kidney donor: Secondary | ICD-10-CM

## 2012-05-12 DIAGNOSIS — N939 Abnormal uterine and vaginal bleeding, unspecified: Secondary | ICD-10-CM

## 2012-05-12 DIAGNOSIS — E039 Hypothyroidism, unspecified: Secondary | ICD-10-CM

## 2012-05-12 LAB — BASIC METABOLIC PANEL
CO2: 28 mEq/L (ref 19–32)
Calcium: 9.4 mg/dL (ref 8.4–10.5)
Glucose, Bld: 74 mg/dL (ref 70–99)
Sodium: 136 mEq/L (ref 135–145)

## 2012-05-12 LAB — CBC
Hemoglobin: 14.4 g/dL (ref 12.0–15.0)
MCH: 28.8 pg (ref 26.0–34.0)
MCV: 83.2 fL (ref 78.0–100.0)
Platelets: 270 10*3/uL (ref 150–400)
RBC: 5 MIL/uL (ref 3.87–5.11)

## 2012-05-12 MED ORDER — TRAMADOL HCL 50 MG PO TABS: 50.0000 mg | ORAL_TABLET | Freq: Four times a day (QID) | ORAL | Status: DC | PRN

## 2012-05-12 NOTE — ED Provider Notes (Signed)
History     CSN: [id number]  Arrival date & time 05/12/12  1711   None     No chief complaint on file.   (Consider location/radiation/quality/duration/timing/severity/associated sxs/prior treatment) HPI Patient is 32 year old female with history of hypothyroidism diagnosed while pregnant, history of donated kidney while living in [place], presents to clinic with main concern of one month duration of vaginal bleeding. She explains that bleeding started as regular menstrual period but it continued and it did not stop. She explains she usually has bleeding for 7 days but this has been rather persistent and associated with crampy lower quadrant abdominal pain, she describes pain as dull and intermittent in etiology, anywhere from 5-7/10 in severity, nonradiating, associated with malaise and poor oral intake. Patient denies fevers and chills, no chest pain or shortness of breath, no similar episodes in the past. She denies other systemic symptoms. She reports being sexually active prior to bleeding started.  Past Medical History  Diagnosis Date  . Chronic kidney disease     Pt has only one kidney, pt. donated a kidney. Pt. not having any problems  . Hypothyroid     Past Surgical History  Procedure Laterality Date  . Donated kidney    . Tonsillectomy    . Cesarean section  12/23/2010    Procedure: CESAREAN SECTION;  Surgeon: Kathreen Cosier, MD;  Location: [facility];  Service: Gynecology;  Laterality: N/A;    Family history of HTN   History  Substance Use Topics  . Smoking status: Never Smoker   . Smokeless tobacco: Never Used  . Alcohol Use: No    OB History   Grav Para Term Preterm Abortions TAB SAB Ect Mult Living   1 1 1       1       Review of Systems  Constitutional: Negative for fever, chills, diaphoresis, activity change, appetite change and fatigue.  HENT: Negative for ear pain, nosebleeds, congestion, facial swelling, rhinorrhea, neck pain, neck stiffness and ear  discharge.   Eyes: Negative for pain, discharge, redness, itching and visual disturbance.  Respiratory: Negative for cough, choking, chest tightness, shortness of breath, wheezing and stridor.   Cardiovascular: Negative for chest pain, palpitations and leg swelling.  Gastrointestinal: Negative for abdominal distention.  Genitourinary: Negative for dysuria, urgency, frequency, hematuria, flank pain, decreased urine volume, difficulty urinating and dyspareunia.  Musculoskeletal: Negative for back pain, joint swelling, arthralgias and gait problem.  Neurological: Negative for dizziness, tremors, seizures, syncope, facial asymmetry, speech difficulty, weakness, light-headedness, numbness and headaches.  Hematological: Negative for adenopathy. Vaginal bleeding per history of present illness Psychiatric/Behavioral: Negative for hallucinations, behavioral problems, confusion, dysphoric mood, decreased concentration and agitation.    Allergies  Review of patient's allergies indicates no known allergies.  Home Medications   Current Outpatient Rx  Name  Route  Sig  Dispense  Refill  . EXPIRED: ferrous sulfate 325 (65 FE) MG tablet   Oral   Take 1 tablet (325 mg total) by mouth 2 (two) times daily before a meal.   60 tablet   11   . levothyroxine (SYNTHROID, LEVOTHROID) 50 MCG tablet   Oral   Take 50 mcg by mouth daily.           Marland Kitchen EXPIRED: norethindrone (ORTHO MICRONOR) 0.35 MG tablet   Oral   Take 1 tablet (0.35 mg total) by mouth daily. 2nd Sunday start   28 tablet   11   . prenatal vitamin w/FE, FA (PRENATAL 1 + 1) 27-1  MG TABS   Oral   Take 1 tablet by mouth daily.             There were no vitals taken for this visit.  Physical Exam  Constitutional: Appears well-developed and well-nourished. No distress.  HENT: Normocephalic. External right and left ear normal. Oropharynx is clear and moist.  Eyes: Conjunctivae and EOM are normal. PERRLA, no scleral icterus.  Neck:  Normal ROM. Neck supple. No JVD. No tracheal deviation. No thyromegaly.  CVS: RRR, S1/S2 +, no murmurs, no gallops, no carotid bruit.  Pulmonary: Effort and breath sounds normal, no stridor, rhonchi, wheezes, rales.  Abdominal: Soft. BS +,  no distension, tenderness, rebound or guarding.  Musculoskeletal: Normal range of motion. No edema and no tenderness.  Lymphadenopathy: No lymphadenopathy noted, cervical, inguinal. Neuro: Alert. Normal reflexes, muscle tone coordination. No cranial nerve deficit. Skin: Skin is warm and dry. No rash noted. Not diaphoretic. No erythema. No pallor.  Psychiatric: Normal mood and affect. Behavior, judgment, thought content normal.    ED Course  Procedures (including critical care time)  Labs Reviewed - No data to display No results found.  Vaginal bleeding - Unclear etiology and certainly worrisome for dysfunctional uterine bleeding, possible miscarriage - Will obtain CBC, urine pregnancy test today - Referral to gynecologist will be made for further evaluation and possible transvaginal ultrasound  History of kidney donation - Will check electrolyte panel today and assess kidney function  Hypothyroidism diagnosed during pregnancy - Will check TSH today   MDM  Prolonged vaginal bleeding        Dorothea Ogle, MD 05/12/12 1800

## 2012-05-12 NOTE — ED Notes (Signed)
Pt here to establish care.  04/19/12 had period had cramping apin that continued...never stopped.

## 2012-05-13 ENCOUNTER — Telehealth: Payer: Self-pay | Admitting: Internal Medicine

## 2012-05-13 NOTE — Telephone Encounter (Signed)
Needs synthroid prescription 50 mcg QD

## 2012-05-17 ENCOUNTER — Inpatient Hospital Stay (HOSPITAL_COMMUNITY): Payer: No Typology Code available for payment source

## 2012-05-17 ENCOUNTER — Inpatient Hospital Stay (HOSPITAL_COMMUNITY)
Admission: AD | Admit: 2012-05-17 | Discharge: 2012-05-17 | Disposition: A | Discharge status: Home / Self Care | Payer: No Typology Code available for payment source | Source: Ambulatory Visit | Attending: Obstetrics & Gynecology | Admitting: Obstetrics & Gynecology

## 2012-05-17 ENCOUNTER — Encounter (HOSPITAL_COMMUNITY): Payer: Self-pay | Admitting: *Deleted

## 2012-05-17 DIAGNOSIS — R109 Unspecified abdominal pain: Secondary | ICD-10-CM | POA: Insufficient documentation

## 2012-05-17 DIAGNOSIS — N949 Unspecified condition associated with female genital organs and menstrual cycle: Secondary | ICD-10-CM | POA: Insufficient documentation

## 2012-05-17 DIAGNOSIS — R9389 Abnormal findings on diagnostic imaging of other specified body structures: Secondary | ICD-10-CM | POA: Insufficient documentation

## 2012-05-17 LAB — URINALYSIS, ROUTINE W REFLEX MICROSCOPIC
Bilirubin Urine: NEGATIVE
Hgb urine dipstick: NEGATIVE
Specific Gravity, Urine: 1.01 (ref 1.005–1.030)
pH: 5.5 (ref 5.0–8.0)

## 2012-05-17 LAB — WET PREP, GENITAL
Clue Cells Wet Prep HPF POC: NONE SEEN
Trich, Wet Prep: NONE SEEN
Yeast Wet Prep HPF POC: NONE SEEN

## 2012-05-17 MED ORDER — OXYCODONE-ACETAMINOPHEN 5-325 MG PO TABS: 2.0000 | ORAL_TABLET | ORAL | Status: DC | PRN

## 2012-05-17 NOTE — MAU Note (Signed)
Pt C/O abdominal pain, states her LMP was 3/3, unsure if pregnant.

## 2012-05-17 NOTE — MAU Note (Signed)
Lower abd pain, had heavy bleeding on 3/3, bled for 3 days, has continued to have severe cramping since that time.  No bleeding today.  Pain radiates into lower back.

## 2012-05-17 NOTE — MAU Provider Note (Signed)
History     CSN: [id number]  Arrival date and time: 05/17/12 4098   First Provider Initiated Contact with Patient 05/17/12 [phone]      Chief Complaint  Patient presents with  . Abdominal Pain   HPI Ms. Kennadie Brenner is a 32 y.o. G1P1001 who presents to MAU today with complaint of lower abdominal pain. The patient had severe pain and heavy bleeding on 04/19/12. The bleeding lasted 3 days, but the pain has stayed. She has the pain everyday, but it comes and goes. It ranges from 3/10 - 10/10 in severity. She rates her pain now at 7/10. She was evaluated at urgent care recently and given Tramadol. She states that this does not work well for the pain. The last dose she took was last night. She has no history of dysmenorrhea. She gets her annual exams at [facility].   OB History   Grav Para Term Preterm Abortions TAB SAB Ect Mult Living   1 1 1       1       Past Medical History  Diagnosis Date  . Chronic kidney disease     Pt has only one kidney, pt. donated a kidney. Pt. not having any problems  . Hypothyroid     Past Surgical History  Procedure Laterality Date  . Donated kidney    . Cesarean section  12/23/2010    Procedure: CESAREAN SECTION;  Surgeon: Kathreen Cosier, MD;  Location: [facility];  Service: Gynecology;  Laterality: N/A;    History reviewed. No pertinent family history.  History  Substance Use Topics  . Smoking status: Never Smoker   . Smokeless tobacco: Never Used  . Alcohol Use: No    Allergies: No Known Allergies  No prescriptions prior to admission    Review of Systems  Constitutional: Negative for fever and malaise/fatigue.  Gastrointestinal: Positive for abdominal pain. Negative for nausea, vomiting, diarrhea and constipation.  Genitourinary: Negative for dysuria, urgency and frequency.       Neg - vaginal bleeding Neg - vaginal discharge  Musculoskeletal: Positive for back pain.   Physical Exam   Blood pressure 93/56, pulse 65, temperature 99 F (37.2  C), temperature source Oral, resp. rate 18, height 5' (1.524 m), weight 130 lb 12.8 oz (59.33 kg), last menstrual period 04/19/2012.  Physical Exam  Constitutional: She is oriented to person, place, and time. She appears well-developed and well-nourished. No distress.  HENT:  Head: Normocephalic and atraumatic.  Cardiovascular: Normal rate.   Respiratory: Effort normal.  GI: Soft. She exhibits no distension and no mass. There is tenderness (mild tenderness to palpation of the lower abdomen more prominent at the midline). There is no rebound and no guarding.  Genitourinary: Vagina normal. Uterus is tender (mild tenderness to palpation on bimanual). Uterus is not enlarged. Cervix exhibits discharge (small amount of white mucus discahrge noted at the cervical os). Cervix exhibits no motion tenderness and no friability. Right adnexum displays no mass and no tenderness. Left adnexum displays no mass and no tenderness.  Neurological: She is alert and oriented to person, place, and time.  Skin: Skin is warm and dry. No erythema.  Psychiatric: She has a normal mood and affect.   Results for orders placed during the hospital encounter of 05/17/12 (from the past 24 hour(s))  URINALYSIS, ROUTINE W REFLEX MICROSCOPIC     Status: None   Collection Time    05/17/12  8:47 AM      Result Value Range  Color, Urine YELLOW  YELLOW   APPearance CLEAR  CLEAR   Specific Gravity, Urine 1.010  1.005 - 1.030   pH 5.5  5.0 - 8.0   Glucose, UA NEGATIVE  NEGATIVE mg/dL   Hgb urine dipstick NEGATIVE  NEGATIVE   Bilirubin Urine NEGATIVE  NEGATIVE   Ketones, ur NEGATIVE  NEGATIVE mg/dL   Protein, ur NEGATIVE  NEGATIVE mg/dL   Urobilinogen, UA 0.2  0.0 - 1.0 mg/dL   Nitrite NEGATIVE  NEGATIVE   Leukocytes, UA NEGATIVE  NEGATIVE  POCT PREGNANCY, URINE     Status: None   Collection Time    05/17/12  8:55 AM      Result Value Range   Preg Test, Ur NEGATIVE  NEGATIVE  WET PREP, GENITAL     Status: Abnormal    Collection Time    05/17/12  9:43 AM      Result Value Range   Yeast Wet Prep HPF POC NONE SEEN  NONE SEEN   Trich, Wet Prep NONE SEEN  NONE SEEN   Clue Cells Wet Prep HPF POC NONE SEEN  NONE SEEN   WBC, Wet Prep HPF POC MANY (*) NONE SEEN   *RADIOLOGY REPORT*  Clinical Data: Lower abdominal and pelvic pain. Cramping.  TRANSABDOMINAL AND TRANSVAGINAL ULTRASOUND OF PELVIS  Technique: Both transabdominal and transvaginal ultrasound  examinations of the pelvis were performed. Transabdominal  technique was performed for global imaging of the pelvis including  uterus, ovaries, adnexal regions, and pelvic cul-de-sac.  It was necessary to proceed with endovaginal exam following the  transabdominal exam to visualize the endometrium and adnexae.  Comparison: 04/30/2010  Findings:  Uterus: 9.8 x 4.9 x 5.9 cm. No fibroids or other uterine mass  identified.  Endometrium: Double layer thickness measures 18 mm transvaginally.  No focal lesion visualized.  Right ovary: 2.1 x 1.8 x 2.3 cm. Normal appearance. A small fluid-  filled tubular structure is seen adjacent to the ovary, consistent  with mild hydrosalpinx. This shows no significant change when  compared with prior exam.  Left ovary: 3.3 x 1.0 x 1.4 cm. Normal appearance. No adnexal  mass identified.  Other Findings: No free fluid  IMPRESSION:  1. No evidence of fibroids or ovarian mass.  2. Endometrial thickness measures 18 mm. Endometrial thickness is  considered abnormal. Consider follow-up by [place] in 6-8 weeks, during  the week immediately following menses (exam timing is critical).  3. Mild right hydrosalpinx, without significant change compared to  prior exam.  Original Report Authenticated By: Myles Rosenthal, M.D.   MAU Course  Procedures None  MDM Wet prep, GC/Chlamydia and [place] today  Assessment and Plan  A: Pelvic pain Endometrial thickening  P: Discharge home Rx for Percocet given to patient Outpatient [place] scheduled for  05/31/12 to follow-up endometrial thickness after period Referral sent to clinic. Patient to follow-up after [place] for further evaluation and management Patient may return to MAU as needed or if her condition were to change or worsen  Freddi Starr, PA-C   05/17/2012, 12:00 PM

## 2012-05-18 LAB — GC/CHLAMYDIA PROBE AMP: CT Probe RNA: NEGATIVE

## 2012-05-31 ENCOUNTER — Ambulatory Visit (HOSPITAL_COMMUNITY)
Admission: RE | Admit: 2012-05-31 | Discharge: 2012-05-31 | Disposition: A | Discharge status: Home / Self Care | Payer: No Typology Code available for payment source | Source: Ambulatory Visit | Attending: Medical | Admitting: Medical

## 2012-05-31 DIAGNOSIS — N949 Unspecified condition associated with female genital organs and menstrual cycle: Secondary | ICD-10-CM | POA: Insufficient documentation

## 2012-05-31 DIAGNOSIS — R9389 Abnormal findings on diagnostic imaging of other specified body structures: Secondary | ICD-10-CM | POA: Insufficient documentation

## 2012-06-10 ENCOUNTER — Encounter: Payer: No Typology Code available for payment source | Admitting: Medical

## 2012-06-14 NOTE — ED Notes (Signed)
[facility]

## 2012-10-26 ENCOUNTER — Ambulatory Visit: Payer: Self-pay | Attending: Internal Medicine | Admitting: Internal Medicine

## 2012-10-26 ENCOUNTER — Encounter: Payer: Self-pay | Admitting: Internal Medicine

## 2012-10-26 VITALS — BP 95/57 | HR 73 | Temp 98.7°F | Wt 133.0 lb

## 2012-10-26 DIAGNOSIS — K219 Gastro-esophageal reflux disease without esophagitis: Secondary | ICD-10-CM

## 2012-10-26 DIAGNOSIS — E039 Hypothyroidism, unspecified: Secondary | ICD-10-CM | POA: Insufficient documentation

## 2012-10-26 DIAGNOSIS — R300 Dysuria: Secondary | ICD-10-CM | POA: Insufficient documentation

## 2012-10-26 DIAGNOSIS — M79609 Pain in unspecified limb: Secondary | ICD-10-CM | POA: Insufficient documentation

## 2012-10-26 DIAGNOSIS — R1013 Epigastric pain: Secondary | ICD-10-CM | POA: Insufficient documentation

## 2012-10-26 DIAGNOSIS — Z905 Acquired absence of kidney: Secondary | ICD-10-CM | POA: Insufficient documentation

## 2012-10-26 LAB — POCT URINALYSIS DIPSTICK
Protein, UA: NEGATIVE
Spec Grav, UA: 1.02
Urobilinogen, UA: 0.2

## 2012-10-26 MED ORDER — COAL TAR EXTRACT 0.5 % EX SHAM: MEDICATED_SHAMPOO | Freq: Every evening | CUTANEOUS | Status: DC | PRN

## 2012-10-26 MED ORDER — OMEPRAZOLE 20 MG PO CPDR: 20.0000 mg | DELAYED_RELEASE_CAPSULE | Freq: Every day | ORAL | Status: DC

## 2012-10-26 NOTE — Progress Notes (Signed)
Patient ID: Joanne [name], female   DOB: January 15, 1981, 32 y.o.   MRN: [id number] PCP:  No primary provider on file.   DOA:  No admission date for patient encounter.  Chief Complaint:  epigastric pain  3 months Dysuria x 5 dyas  HPI: 32 year old Hispanic female here with Spanish interpreter. He reports having burning epigastric pain for almost 3 months also see to read nausea and sour taste in her mouth. Denies any radiation of pain. Denies abdominal bloating or vomiting. She also reports burning urination for past 5 days. He also reports from infection over the left great toe for which she was given topical antifungal but has not improved her symptoms. Reports pain over  left great toe on walking.  Allergies: No Known Allergies  Prior to Admission medications   Medication Sig Start Date End Date Taking? Authorizing Provider  coal tar (NEUTROGENA T/GEL) 0.5 % shampoo Apply topically at bedtime as needed. 10/26/12   Lamont Tant, MD  omeprazole (PRILOSEC) 20 MG capsule Take 1 capsule (20 mg total) by mouth daily. 10/26/12   Eddie North, MD    Past Medical History  Diagnosis Date  . Chronic kidney disease     Pt has only one kidney, pt. donated a kidney. Pt. not having any problems  . Hypothyroid     Past Surgical History  Procedure Laterality Date  . Donated kidney    . Cesarean section  12/23/2010    Procedure: CESAREAN SECTION;  Surgeon: Kathreen Cosier, MD;  Location: [facility];  Service: Gynecology;  Laterality: N/A;    Social History:  reports that she has never smoked. She has never used smokeless tobacco. She reports that she does not drink alcohol or use illicit drugs.  No family history on file.  Review of Systems:  As outlined in history of present illness  Physical Exam:  Filed Vitals:   10/26/12 1732  BP: 95/57  Pulse: 73  Temp: 98.7 F (37.1 C)  TempSrc: Oral  Weight: 133 lb (60.328 kg)  SpO2: 96%    Constitutional: Vital signs reviewed.  Patient is a  well-developed and well-nourished in no acute distress and cooperative with exam.  HEENT: No pallor, moist oral mucosa  Cardiovascular: RRR, S1 normal, S2 normal, no MRG,  Pulmonary/Chest: CTAB, no wheezes, rales, or rhonchi Abdominal: Soft. non-distended, bowel sounds are normal, mild epigastric tenderness to palpation  Extremities: Infected great toenail. Nontender CNS: AAO x3 Labs on Admission:  No results found for this or any previous visit (from the past 48 hour(s)).  Radiological Exams on Admission: US Transvaginal Non-ob  05/31/2012   *RADIOLOGY REPORT*  Clinical Data: Pelvic pain, follow-up borderline endometrial thickening.  Probable right hydrosalpinx on prior exam, reported stable since previous exams.  TRANSVAGINAL ULTRASOUND OF PELVIS  Technique:  Transvaginal ultrasound examination of the pelvis was performed including evaluation of the uterus, ovaries, adnexal regions, and pelvic cul-de-sac.  Comparison:  05/17/2012  Findings:  Uterus:  6.8 x 5.9 x 3.6 cm.  Anteverted, anteflexed.  Normal.  Endometrium: 0.9 cm.  Trilaminar in appearance without focal abnormality.  Right ovary: 2.3 x 2.1 x 1.8 cm.  Normal.  Allowing for differences in technique, no significant change in tubular fluid filled structure in the right adnexa measuring 3.0 x 2.6 x 1.4 cm.  Left ovary: 3.3 x 1.4 x 1.0 cm.  Other Findings:  Trace fluid in the cul-de-sac.  IMPRESSION: Normal-appearing endometrium allowing for mid cycle scanning.  Stable probable right hydrosalpinx.   Original  Report Authenticated By: Christiana Pellant, M.D.    Assessment/Plan Epigastric pain Like in the setting of GERD treated with an oral course of Prilosec. Counseled on avoiding spicy and fatty foods.  Dysuria Urine dipstick negative for infection pediatrics with some blood but likely in the setting of recent menstrual bleeding.  ?Infected/ painful left toenail.  will refer her to podiatry for evaluation    Andreana Klingerman 10/26/2012,  5:48 PM

## 2012-10-27 ENCOUNTER — Ambulatory Visit: Payer: Self-pay | Admitting: Internal Medicine

## 2012-11-04 ENCOUNTER — Ambulatory Visit: Payer: No Typology Code available for payment source | Attending: Internal Medicine

## 2013-01-13 ENCOUNTER — Emergency Department (HOSPITAL_COMMUNITY)
Admission: EM | Admit: 2013-01-13 | Discharge: 2013-01-13 | Disposition: A | Discharge status: Home / Self Care | Payer: No Typology Code available for payment source | Attending: Emergency Medicine | Admitting: Emergency Medicine

## 2013-01-13 ENCOUNTER — Encounter (HOSPITAL_COMMUNITY): Payer: Self-pay | Admitting: Emergency Medicine

## 2013-01-13 DIAGNOSIS — R61 Generalized hyperhidrosis: Secondary | ICD-10-CM | POA: Insufficient documentation

## 2013-01-13 DIAGNOSIS — R209 Unspecified disturbances of skin sensation: Secondary | ICD-10-CM | POA: Insufficient documentation

## 2013-01-13 DIAGNOSIS — Z8639 Personal history of other endocrine, nutritional and metabolic disease: Secondary | ICD-10-CM | POA: Insufficient documentation

## 2013-01-13 DIAGNOSIS — R079 Chest pain, unspecified: Secondary | ICD-10-CM | POA: Insufficient documentation

## 2013-01-13 DIAGNOSIS — Z79899 Other long term (current) drug therapy: Secondary | ICD-10-CM | POA: Insufficient documentation

## 2013-01-13 DIAGNOSIS — F410 Panic disorder [episodic paroxysmal anxiety] without agoraphobia: Secondary | ICD-10-CM

## 2013-01-13 DIAGNOSIS — Z905 Acquired absence of kidney: Secondary | ICD-10-CM | POA: Insufficient documentation

## 2013-01-13 DIAGNOSIS — N189 Chronic kidney disease, unspecified: Secondary | ICD-10-CM | POA: Insufficient documentation

## 2013-01-13 DIAGNOSIS — F411 Generalized anxiety disorder: Secondary | ICD-10-CM | POA: Insufficient documentation

## 2013-01-13 DIAGNOSIS — Z862 Personal history of diseases of the blood and blood-forming organs and certain disorders involving the immune mechanism: Secondary | ICD-10-CM | POA: Insufficient documentation

## 2013-01-13 MED ORDER — LORAZEPAM 2 MG/ML IJ SOLN
1.0000 mg | Freq: Once | INTRAMUSCULAR | Status: AC
  Administered 2013-01-13: 1 mg via INTRAVENOUS

## 2013-01-13 MED ORDER — LORAZEPAM 2 MG/ML IJ SOLN
1.0000 mg | Freq: Once | INTRAMUSCULAR | Status: DC
  Filled 2013-01-13: qty 1

## 2013-01-13 MED ORDER — SODIUM CHLORIDE 0.9 % IV BOLUS (SEPSIS)
1000.0000 mL | Freq: Once | INTRAVENOUS | Status: AC
  Administered 2013-01-13: 1000 mL via INTRAVENOUS

## 2013-01-13 MED ORDER — LORAZEPAM 2 MG/ML IJ SOLN: 1.0000 mg | Freq: Once | INTRAMUSCULAR | Status: DC

## 2013-01-13 MED ORDER — LORAZEPAM 0.5 MG PO TABS: 1.0000 mg | ORAL_TABLET | Freq: Two times a day (BID) | ORAL | Status: DC | PRN

## 2013-01-13 NOTE — ED Provider Notes (Signed)
CSN: [id number]     Arrival date & time 01/13/13  1145 History   First MD Initiated Contact with Patient 01/13/13 1146     Chief Complaint  Patient presents with  . Panic Attack  . Chest Pain   (Consider location/radiation/quality/duration/timing/severity/associated sxs/prior Treatment) Patient is a 32 y.o. female presenting with chest pain and anxiety.  Chest Pain Associated symptoms: anxiety, diaphoresis and numbness (to bilateral hands)   Anxiety This is a recurrent problem. The current episode started today. The problem occurs intermittently. The problem has been gradually improving. Associated symptoms include chest pain, diaphoresis and numbness (to bilateral hands). She has tried nothing for the symptoms.    Language: Spanish Speaking, some engligh  Marianny Goris is a 32 y.o.female with a significant PMH of panic attacks, hypothyroid, 1 kidney after donating  presents to the ER with complaints of panic attack. 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  She says that she has had panic attacks before and that everyone is at home celebrating at her house without her. Two [place] was started on her and she says that it is helping a lot. She endorses feeling really hot, shakey, unable to take a large breath and nervous. She denies having a chest pain but feels a little bit of pressure. She says this has happened before. She was fine when waking up this morning.   Past Medical History  Diagnosis Date  . Chronic kidney disease     Pt has only one kidney, pt. donated a kidney. Pt. not having any problems  . Hypothyroid    Past Surgical History  Procedure Laterality Date  . Donated kidney    . Cesarean section  12/23/2010    Procedure: CESAREAN SECTION;  Surgeon: Kathreen Cosier, MD;  Location: [facility];  Service: Gynecology;  Laterality: N/A;   History reviewed. No pertinent  family history. History  Substance Use Topics  . Smoking status: Never Smoker   . Smokeless tobacco: Never Used  . Alcohol Use: No   OB History   Grav Para Term Preterm Abortions TAB SAB Ect Mult Living   1 1 1       1      Review of Systems  Constitutional: Positive for diaphoresis.  Cardiovascular: Positive for chest pain.  Neurological: Positive for numbness (to bilateral hands).    Allergies  Review of patient's allergies indicates no known allergies.  Home Medications   Current Outpatient Rx  Name  Route  Sig  Dispense  Refill  . coal tar (NEUTROGENA T/GEL) 0.5 % shampoo   Topical   Apply topically at bedtime as needed.   240 mL   12   . omeprazole (PRILOSEC) 20 MG capsule   Oral   Take 1 capsule (20 mg total) by mouth daily.   30 capsule   1   . LORazepam (ATIVAN) 0.5 MG tablet   Oral   Take 2 tablets (1 mg total) by mouth 2 (two) times daily as needed for anxiety.   10 tablet   0    BP 82/44  Pulse 56  Resp 15  SpO2 100%  LMP 12/18/2012 Physical Exam  Nursing note and vitals reviewed. Constitutional: She appears well-developed and well-nourished. No distress.  HENT:  Head: Normocephalic and atraumatic.  Eyes: Pupils are equal, round, and reactive to light.  Neck: Normal range  of motion. Neck supple.  Cardiovascular: Normal rate and regular rhythm.   Pulmonary/Chest: Effort normal.  Abdominal: Soft.  Neurological: She is alert.  Skin: Skin is warm. She is diaphoretic.  Psychiatric: Her mood appears anxious. She expresses no homicidal and no suicidal ideation.    ED Course  Procedures (including critical care time) Labs Review Labs Reviewed - No data to display Imaging Review No results found.  EKG Interpretation   None     TAESHA, GOODELL UJ:[id number] 13-Jan-2013  11:50:06 [facility]-NLD ROUTINE RECORD Sinus rhythm  Borderline right axis deviation Vent. rate 71 BPM PR interval 128 ms QRS duration 80 ms QT/QTc 396/430  ms P-R-T axes 39 91 47  MDM   1. Panic attack       12:05 pm Patient having severe anxiety. Was doing much better after nasal cannula. When her husband and son came into the room the patient started panicking again, yelling loudly and hyperventilating. The nurse encouraged her to slow her breathing and she started calming down. Continues to deny chest pain.   Patient given 1 mg of IV Ativan  1:23 pm- [atient doing much better. Calm and resting. VS continue to remain stable. Asymptomatic.  Rx: 0.5 mg Ativan prn BID. Referral to behavioral health, work note.  32 y.o.Romell Alavez's evaluation in the Emergency Department is complete. It has been determined that no acute conditions requiring further emergency intervention are present at this time. The patient/guardian have been advised of the diagnosis and plan. We have discussed signs and symptoms that warrant return to the ED, such as changes or worsening in symptoms.  Vital signs are stable at discharge. Filed Vitals:   01/13/13 1230  BP: 82/44  Pulse: 56  Resp: 15    Patient/guardian has voiced understanding and agreed to follow-up with the PCP or specialist.   Dorthula Matas, PA-C 01/13/13 1323

## 2013-01-13 NOTE — ED Notes (Signed)
136/81 Hr- 80 SpO2- 100 RR- 36---24 currently Pt was at work and started hyperventilating. 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 Pt has hx of panic attacks and thyroid problems. Pt is currently holding chest and breathing heavily.

## 2013-01-13 NOTE — ED Notes (Addendum)
Pt began having a panic attack and hyperventilating. Felipa Eth, RN and Tresa Endo, RN came to beside along with Neva Seat, [place]. PA changed order from 1 mg of ativan IM to 1 mg of ativan IV. IV was placed and medication was given. Pt stopped hyperventilating. Pt is calm and cooperative with husband and son at bedside.

## 2013-01-13 NOTE — ED Provider Notes (Signed)
Medical screening examination/treatment/procedure(s) were performed by non-physician practitioner and as supervising physician I was immediately available for consultation/collaboration.  EKG Interpretation   None        Kaye Luoma R. Cohen Doleman, MD 01/13/13 1557 

## 2013-12-19 ENCOUNTER — Ambulatory Visit: Payer: Self-pay

## 2013-12-19 ENCOUNTER — Encounter (HOSPITAL_COMMUNITY): Payer: Self-pay | Admitting: Emergency Medicine

## 2014-01-02 ENCOUNTER — Ambulatory Visit: Payer: Self-pay | Attending: Internal Medicine

## 2014-01-16 ENCOUNTER — Ambulatory Visit (HOSPITAL_BASED_OUTPATIENT_CLINIC_OR_DEPARTMENT_OTHER): Payer: Self-pay | Admitting: *Deleted

## 2014-01-16 ENCOUNTER — Other Ambulatory Visit (HOSPITAL_COMMUNITY)
Admission: RE | Admit: 2014-01-16 | Discharge: 2014-01-16 | Disposition: A | Discharge status: Home / Self Care | Payer: Self-pay | Source: Ambulatory Visit | Attending: Internal Medicine | Admitting: Internal Medicine

## 2014-01-16 ENCOUNTER — Ambulatory Visit: Payer: Self-pay | Attending: Internal Medicine | Admitting: Internal Medicine

## 2014-01-16 ENCOUNTER — Other Ambulatory Visit (HOSPITAL_COMMUNITY): Admission: RE | Admit: 2014-01-16 | Payer: Self-pay | Source: Ambulatory Visit

## 2014-01-16 ENCOUNTER — Encounter: Payer: Self-pay | Admitting: Internal Medicine

## 2014-01-16 VITALS — BP 102/62 | HR 54 | Temp 98.0°F | Resp 18 | Ht 60.5 in | Wt 127.0 lb

## 2014-01-16 DIAGNOSIS — R102 Pelvic and perineal pain: Secondary | ICD-10-CM | POA: Insufficient documentation

## 2014-01-16 DIAGNOSIS — Z23 Encounter for immunization: Secondary | ICD-10-CM

## 2014-01-16 DIAGNOSIS — Z0000 Encounter for general adult medical examination without abnormal findings: Secondary | ICD-10-CM | POA: Insufficient documentation

## 2014-01-16 DIAGNOSIS — Z124 Encounter for screening for malignant neoplasm of cervix: Secondary | ICD-10-CM | POA: Insufficient documentation

## 2014-01-16 LAB — CERVICOVAGINAL ANCILLARY ONLY
WET PREP (BD AFFIRM): NEGATIVE
WET PREP (BD AFFIRM): NEGATIVE
Wet Prep (BD Affirm): NEGATIVE

## 2014-01-16 LAB — CBC
HCT: 38.4 % (ref 36.0–46.0)
Hemoglobin: 13.2 g/dL (ref 12.0–15.0)
MCH: 28.4 pg (ref 26.0–34.0)
MCHC: 34.4 g/dL (ref 30.0–36.0)
MCV: 82.6 fL (ref 78.0–100.0)
MPV: 9.9 fL (ref 9.4–12.4)
PLATELETS: 286 10*3/uL (ref 150–400)
RBC: 4.65 MIL/uL (ref 3.87–5.11)
RDW: 14.8 % (ref 11.5–15.5)
WBC: 5.2 10*3/uL (ref 4.0–10.5)

## 2014-01-16 LAB — TSH: TSH: 3.112 u[IU]/mL (ref 0.350–4.500)

## 2014-01-16 LAB — T4, FREE: Free T4: 1.01 ng/dL (ref 0.80–1.80)

## 2014-01-16 NOTE — Progress Notes (Signed)
Patient ID: Joanne MinisterIsabel [name], female   DOB: 12/20/1980, 33 y.o.   MRN: [id number]  CC:  Annual physical exam  HPI:  Patient presents to clinic today for a annual physical exam.  She reports that her last pap smear was 2.5 years ago which was normal at that time.  She denies vaginal discharge, itch, odor, or lesions.  She has had some dyspareunia for two weeks. She has not noticed any breast changes.  She is currently sexually active and is not on any form of birth control at the moment.  LMP 11/11. She has been trying to get pregnant and has only had one child in the past 11 years without using any form of contraceptive.  She denies tobacco, illicit drug, and alcohol use.  She is not married with one child, and employed.  She would like to have her kidney tested due to only having one kidney.  She reports that she donated one of her kidneys to a sick friend 6 years ago.   Has a history of thyroid disease but states that she stopped taking medication in August.  She was at that time on synthroid but does not remember the dose of the medication.  She is having symptoms of hair loss, fatigue, and weight loss. She reports that she has lost 10 pounds in two weeks and has not changed her diet.      No Known Allergies Past Medical History  Diagnosis Date  . Chronic kidney disease     Pt has only one kidney, pt. donated a kidney. Pt. not having any problems  . Hypothyroid    Current Outpatient Prescriptions on File Prior to Visit  Medication Sig Dispense Refill  . coal tar (NEUTROGENA T/GEL) 0.5 % shampoo Apply topically at bedtime as needed. (Patient not taking: Reported on 01/16/2014) 240 mL 12  . LORazepam (ATIVAN) 0.5 MG tablet Take 2 tablets (1 mg total) by mouth 2 (two) times daily as needed for anxiety. (Patient not taking: Reported on 01/16/2014) 10 tablet 0  . omeprazole (PRILOSEC) 20 MG capsule Take 1 capsule (20 mg total) by mouth daily. (Patient not taking: Reported on 01/16/2014) 30 capsule 1    No current facility-administered medications on file prior to visit.   History reviewed. No pertinent family history. History   Social History  . Marital Status: Married    Spouse Name: N/A    Number of Children: N/A  . Years of Education: N/A   Occupational History  . Not on file.   Social History Main Topics  . Smoking status: Never Smoker   . Smokeless tobacco: Never Used  . Alcohol Use: No  . Drug Use: No  . Sexual Activity: Yes    Birth Control/ Protection: None   Other Topics Concern  . Not on file   Social History Narrative    Review of Systems  Constitutional: Positive for weight loss.  Respiratory: Negative.   Cardiovascular: Negative.   Gastrointestinal: Positive for abdominal pain (cramps after cycle ends).  All other systems reviewed and are negative.     Objective:   Filed Vitals:   01/16/14 1147  BP: 102/62  Pulse: 54  Temp: 98 F (36.7 C)  Resp: 18    Physical Exam  Constitutional: She is oriented to person, place, and time. No distress.  HENT:  Right Ear: External ear normal.  Left Ear: External ear normal.  Mouth/Throat: Oropharynx is clear and moist.  Eyes: Conjunctivae and EOM are normal. Pupils are  equal, round, and reactive to light.  Neck: Normal range of motion. Neck supple.  Cardiovascular: Normal rate, regular rhythm and normal heart sounds.   Pulmonary/Chest: Effort normal and breath sounds normal.  Abdominal: Soft. Bowel sounds are normal. There is tenderness (left pelvic pain).  Genitourinary: Rectum normal, vagina normal and uterus normal. Cervix exhibits no motion tenderness, no discharge and no friability. Right adnexum displays no tenderness. Left adnexum displays no tenderness. No vaginal discharge found.  Musculoskeletal: Normal range of motion. She exhibits no edema or tenderness.  Lymphadenopathy:       Right: No inguinal adenopathy present.       Left: No inguinal adenopathy present.  Neurological: She is alert  and oriented to person, place, and time. She has normal reflexes. No cranial nerve deficit.  Skin: Skin is warm and dry. She is not diaphoretic.  Psychiatric: She has a normal mood and affect.     Lab Results  Component Value Date   WBC 5.9 05/12/2012   HGB 14.4 05/12/2012   HCT 41.6 05/12/2012   MCV 83.2 05/12/2012   PLT 270 05/12/2012   Lab Results  Component Value Date   CREATININE 1.08 05/12/2012   BUN 17 05/12/2012   NA 136 05/12/2012   K 3.9 05/12/2012   CL 100 05/12/2012   CO2 28 05/12/2012    No results found for: HGBA1C Lipid Panel  No results found for: CHOL, TRIG, HDL, CHOLHDL, VLDL, LDLCALC     Assessment and plan:   Joanne [name] was seen today for annual exam and gynecologic exam.  Diagnoses and associated orders for this visit:  Annual physical exam - HIV antibody (with reflex) - RPR - Lipid panel - CBC - COMPLETE METABOLIC PANEL WITH GFR - TSH - Vitamin D, 25-hydroxy - Cervicovaginal ancillary only - Cytology - PAP [place] - T4, Free  Pelvic pain in female - US Pelvis Complete; Future - US Transvaginal Non-OB; Future--r/o ovarian cyst   Due to language barrier, an interpreter was present during the history-taking and subsequent discussion (and for part of the physical exam) with this patient.  Return if symptoms worsen or fail to improve.       Holland CommonsKECK, VALERIE, NP-C [facility] [phone] 01/16/2014, 12:05 PM

## 2014-01-16 NOTE — Patient Instructions (Signed)
Clarinda (Health Maintenance) Adoptar un estilo de vida saludable y recibir atencin preventiva pueden ser de suma utilidad para promover la salud y Musician. Hable con el mdico para saber cul es el esquema de exmenes peridicos adecuado para usted. Esta es una buena oportunidad para Teacher, adult education peridicamente al mdico sobre cmo prevenir enfermedades y [place] sano. Entre cada control mdico, hay muchas cosas que puede hacer por s solo. Los expertos han investigado mucho acerca de los cambios en el estilo de vida y las medidas preventivas que muy probablemente preserven su salud. Consulte al mdico para obtener ms informacin. EL PESO Y LA DIETA  Consuma una dieta saludable.  Incluya abundante cantidad de verduras, frutas, productos lcteos descremados y protenas magras.  No coma muchos alimentos con alto contenido de grasas slidas, azcares agregados o sal.  Realice actividad fsica con regularidad. Esta es una de las cosas ms importantes que puede hacer por su salud.  La [employer] de las personas adultas deben hacer actividad fsica durante por lo menos 140mnutos semanales. El ejercicio debe aumentar la frecuencia cardaca y hNature conservation officersudar (ejercicio de intensidad moderada).  Adems, casi todos los adultos deben hacer ejercicios de fortalecimiento al mToysRusveces por semana como complemento del ejercicio de iDante Mantenga un peso saludable.  El ndice de masa corporal ([facility] es una medida que puede usarse para identificar posibles problemas relacionados con el peso. Este ofrece un clculo estimativo de la gAir traffic controlleren funcin del peso y lAgricultural consultant El mdico puede determinar su IEncino Outpatient Surgery Center LLCy ayudarlo a aScience writery mTheatre managerun peso saludable.  Para las mujeres mayores de 20aos:  Un ISurgicare Surgical Associates Of Mahwah LLCmenor de 18,5 se considera bajo peso.  Un IBaptist Health Medical Center - North Little Rockentre 18,5 y 24,9 es normal.  Un ITerrell State Hospitalentre 25 y 29,9 es sobrepeso.  Un IMC de 30 o ms se considera  obesidad. Controlar los niveles de colesterol y lpidos en la sangre  Debe comenzar a hacerse anlisis de sangre para controlar los nAscutneyde lpidos y colesterol a partir de lSed[place] y repetir estos estudios cada 5aos.  Tal vez deba someterse a controles de los niveles de colesterol con ms frecuencia si:  Tiene los niveles de lpidos o colesterol elevados.  Es mayor de 50aos.  Tiene un riesgo alto de tener enfermedades cardacas. DETECCIN DE CNCER  Cncer de pulmn  Se recomienda realizar exmenes de deteccin de cncer de pulmn a las personas adultas que tienen entre 581y 80aos, y corren riesgo de tBest boycncer de pulmn debido a sus antecedentes de tabaquismo.  Se recomienda realizar una tomografa computarizada anual de baja dosis de los pulmones a las personas que:  Siguen fumando.  Hayan dejado de fumar en los ltimos 15aos.  Hayan fumado un paquete diario durante 30aos. El ndice ao-paquete equivale a fumar, en promedio, un paquete de cigarrillos diario durante 1ao.  Debe seguir realizndose estudios de deteccin anual hasta que hayan pasado 15aos desde que dej de fumar.  Estos estudios deben suspenderse si tiene un problema de salud que le impedira recibir tratamiento para eScience writerde pulmn. Cncer de mama  Ponga en prctica la "autoconciencia de las mamas". Esto significa reconocer la apariencia normal de las mamas y cSt. [place]  Adems implica realizarse autoexmenes peridicos de lJohnson & Johnson Informe al mdico si hay algn cambio, sin importar cun pequeo sea.  Si tiene entre 20 y 30aos, un mdico debe hacerle un examen clnico de las mamas cada 1 a 316aoscomo parte del  examen habitual de salud.  Si es mayor de 40aos, debe Electrical engineer un examen clnico de las Microsoft. Tambin debe considerar la posibilidad de realizarse una radiografa de las mamas ([place]) todos los [place].  Si tiene antecedentes familiares de cncer de  mama, hable con el mdico para saber si debe someterse a un estudio gentico.  Si tiene un riesgo alto de Animal nutritionist de mama, hable con el mdico para saber si debe hacerse una resonancia magntica y [employer].  Se recomienda una evaluacin del gen del cncer de mama (BRCA) a las mujeres que tengan familiares con tumores malignos relacionados con el BRCA. Los tumores malignos relacionados con elBRCA incluyen:  [employer].  Los [employer].  Los tumores malignos del peritoneo.  Los resultados de la evaluacin determinarn la necesidad de asesoramiento gentico y de [place] de BRCA1 y BRCA2. Cncer de cuello uterino Ya no se recomiendan los exmenes plvicos de rutina para la deteccin del cncer de cuello uterino en las mujeres que no estn embarazadas que son consideradas sujetos de bajo riesgo de Best boy cncer de los rganos de la pelvis (ovarios, tero y vagina) y que no tienen sntomas. Tal vez sea necesario realizar un examen plvico si tiene sntomas, incluidos aquellos que estn asociados con infecciones en la pelvis. Pregntele al mdico si un examen plvico de deteccin es adecuado para usted.   El Papanicolau es la prueba de deteccin del cncer de cuello uterino para las mujeres que podran Engineer, production.  Si le han realizado una histerectoma por un problema que no era cncer u otra enfermedad que podra causar cncer, ya no necesitar realizarse pruebas de Papanicolaou.  Si es mayor de 39aos y los resultados de las pruebas de Papanicolaou han sido normales durante los ltimos 10aos, ya no es necesario que se realice estos estudios.  Si ha recibido un tratamiento para el cncer de cuello uterino o para una enfermedad que podra causar cncer, necesitar realizarse una prueba de Papanicolaou y controles durante al menos 26 aos de concluido el [place].  Si ya no se realiza pruebas de Papanicolaou, debe evaluar sus factores de riesgo si estos  se modifican (por ejemplo, tiene un nuevo compaero sexual). Esta situacin puede influir en la necesidad de que se someta nuevamente a estudios de deteccin.  Algunas mujeres sufren problemas mdicos que aumentan la probabilidad de tener cncer de cuello uterino. En este caso, el mdico podr indicarle que se someta a exmenes de deteccin y pruebas de Papanicolaou con ms frecuencia.  La prueba del virus del Engineer, technical sales (VPH) es un estudio adicional que puede usarse para la deteccin del cncer de cuello uterino. Esta prueba busca la presencia del virus que puede causar cambios celulares en el cuello del tero. Las clulas que se recolectan durante la prueba de Papanicolaou pueden usarse para el VPH.  La prueba del VPH puede usarse para examinar a las [employer] de 46TKP. Someterse a International aid/development worker del VPH puede prolongar el [employer] las pruebas de Papanicolaou normales de tres a Product manager.  Adems, se debe realizar la prueba del VPH para evaluar a las mujeres de cualquier edad cuyos resultados del Papanicolau no sean claros.  Despus de los 30aos, las mujeres deben realizarse pruebas del VPH con la misma frecuencia que las pruebas de Papanicolau. Cncer colorrectal  Es posible detectar este tipo de cncer y, a menudo, es posible prevenirlo.  Generalmente, los estudios de deteccin de  rutina del cncer colorrectal empiezan a hacerse a partir de los 50aos y continan hasta los 75aos.  El mdico puede recomendar que se los haga antes, si tiene factores de riesgo de cncer de colon.  Adems, el mdico puede recomendar que use un kit de prueba casera para hallar sangre oculta en la materia fecal.  Es posible que se use una pequea cmara en el extremo de un tubo para examinar directamente el colon (sigmoidoscopa o colonoscopa), con el fin de detectar las formas ms incipientes de cncer colorrectal.  Generalmente, los estudios de deteccin de rutina se realizan  a partir de los 50aos.  El examen directo del colon debe repetirse cada 5 a 10aos hasta cumplir 75aos. Sin embargo, tal vez deba someterse a la prueba de deteccin con ms frecuencia si se encuentran formas incipientes de plipos precancerosos o pequeos tumores. Cncer de piel  Revsese la piel peridicamente desde los dedos de los pies hasta la cabeza.  Informe al mdico si aparecen nuevos lunares o si nota cambios en los que ya tiene, especialmente en la forma o el color.  Tambin notifquele si tiene un lunar cuyo tamao es ms grande que el de la goma de un lpiz.  Use siempre pantalla solar. Aplique pantalla solar tantas veces como pueda a lo largo del da.  Protjase usando mangas y pantalones largos, un sombrero de ala ancha y gafas para el sol todo el ao, siempre que est al aire libre. ENFERMEDADES CARDACAS, DIABETES E HIPERTENSIN ARTERIAL   Debe controlarse la presin arterial al menos cada 1 o 2aos. La hipertensin arterial causa enfermedades cardacas y aumenta el riesgo de ictus.  Si tiene entre 55 y 79aos, consulte al mdico si debe tomar aspirina para prevenir ictus.  Hgase anlisis peridicos para la diabetes, que incluyen la toma de una muestra de sangre para controlar el nivel de azcar en la sangre mientras est en ayunas.  Si su peso es normal y tiene un riesgo bajo de tener diabetes, hgase este anlisis una vez cada tres aos, despus de los 45aos.  Si tiene sobrepeso y un riesgo alto de sufrir diabetes, considere la posibilidad de hacerse los anlisis a una edad ms temprana o con ms frecuencia. PREVENCIN DE INFECCIONES  Hepatitis B  Si tiene un riesgo ms alto de tener hepatitisB, debe hacerse anlisis de deteccin de este virus. Se considera que tiene un alto riesgo de hepatitis B si:  Naci en un pas donde la hepatitisB es frecuente. Pregntele al mdico qu pases son considerados de alto riesgo.  Sus padres nacieron en un pas de alto  riesgo, y usted no recibi la vacuna contra la hepatitis B.  Tiene VIH o sida.  Usa agujas para inyectarse drogas.  Convive con una persona que tiene hepatitisB.  Tuvo relaciones sexuales con una persona que tiene hepatitisB.  Recibe tratamiento de hemodilisis.  Toma ciertos medicamentos para cuadros clnicos tales como cncer, trasplante de  

## 2014-01-16 NOTE — Progress Notes (Signed)
Annual physical and pap smear  

## 2014-01-17 LAB — COMPLETE METABOLIC PANEL WITH GFR
ALK PHOS: 49 U/L (ref 39–117)
ALT: 18 U/L (ref 0–35)
AST: 17 U/L (ref 0–37)
Albumin: 4.2 g/dL (ref 3.5–5.2)
BUN: 10 mg/dL (ref 6–23)
CO2: 27 mEq/L (ref 19–32)
Calcium: 9.2 mg/dL (ref 8.4–10.5)
Chloride: 104 mEq/L (ref 96–112)
Creat: 0.8 mg/dL (ref 0.50–1.10)
GFR, Est African American: >89 mL/min
GFR, Est Non African American: >89 mL/min
Glucose, Bld: 82 mg/dL (ref 70–99)
Potassium: 3.6 mEq/L (ref 3.5–5.3)
SODIUM: 137 meq/L (ref 135–145)
TOTAL PROTEIN: 6.9 g/dL (ref 6.0–8.3)
Total Bilirubin: 0.4 mg/dL (ref 0.2–1.2)

## 2014-01-17 LAB — LIPID PANEL
CHOLESTEROL: 134 mg/dL (ref 0–200)
HDL: 59 mg/dL (ref >39)
LDL Cholesterol: 65 mg/dL (ref 0–99)
TRIGLYCERIDES: 48 mg/dL (ref <150)
Total CHOL/HDL Ratio: 2.3 Ratio
VLDL: 10 mg/dL (ref 0–40)

## 2014-01-17 LAB — HIV ANTIBODY (ROUTINE TESTING W REFLEX): HIV: NONREACTIVE

## 2014-01-17 LAB — RPR

## 2014-01-17 LAB — CERVICOVAGINAL ANCILLARY ONLY
CHLAMYDIA, DNA PROBE: NEGATIVE
NEISSERIA GONORRHEA: NEGATIVE

## 2014-01-17 LAB — VITAMIN D 25 HYDROXY (VIT D DEFICIENCY, FRACTURES): Vit D, 25-Hydroxy: 16 ng/mL — ABNORMAL LOW (ref 30–100)

## 2014-01-17 LAB — CYTOLOGY - PAP

## 2014-01-24 ENCOUNTER — Ambulatory Visit (HOSPITAL_COMMUNITY): Payer: Self-pay

## 2014-01-24 ENCOUNTER — Ambulatory Visit (HOSPITAL_COMMUNITY)
Admission: RE | Admit: 2014-01-24 | Discharge: 2014-01-24 | Disposition: A | Discharge status: Home / Self Care | Payer: Self-pay | Source: Ambulatory Visit | Attending: Internal Medicine | Admitting: Internal Medicine

## 2014-01-24 DIAGNOSIS — R102 Pelvic and perineal pain: Secondary | ICD-10-CM | POA: Insufficient documentation

## 2014-01-24 DIAGNOSIS — N8320 Unspecified ovarian cysts: Secondary | ICD-10-CM | POA: Insufficient documentation

## 2014-01-30 ENCOUNTER — Telehealth: Payer: Self-pay | Admitting: Internal Medicine

## 2014-01-30 NOTE — Telephone Encounter (Signed)
Pt. Came into facility to request blood work results, pt. States that the PCP was waiting on results of Thyroid Panel in order to know what type of medication to give pt. But now has been out of any type of medication since last visit, pt. Also wanted to inquire about abdominal U/S results, pt. States that pain is constant and would like to speak to nurse. Please f/u with pt with an interpreter.

## 2014-02-03 ENCOUNTER — Telehealth: Payer: Self-pay | Admitting: Internal Medicine

## 2014-02-03 NOTE — Telephone Encounter (Signed)
Patient has come in today to ask that she be contacted about the results of her last Lab appointment; please f/u with patient

## 2014-02-06 ENCOUNTER — Telehealth: Payer: Self-pay | Admitting: Emergency Medicine

## 2014-02-06 ENCOUNTER — Other Ambulatory Visit: Payer: Self-pay | Admitting: Emergency Medicine

## 2014-02-06 MED ORDER — VITAMIN D (ERGOCALCIFEROL) 1.25 MG (50000 UNIT) PO CAPS: 50000.0000 [IU] | ORAL_CAPSULE | ORAL | Status: DC

## 2014-02-06 NOTE — Telephone Encounter (Signed)
Please post pt U/s results

## 2014-02-06 NOTE — Telephone Encounter (Signed)
Left voicemail message with Joanne [name] to discuss pt bp concerns with pregnancy and set up prenatal visit

## 2014-02-06 NOTE — Telephone Encounter (Signed)
Clarification- message on wrong pt. Please discard following notation

## 2014-02-06 NOTE — Telephone Encounter (Signed)
Attempted to reach pt with lab results/pap smear; left voicemail Medication [facility]

## 2014-02-06 NOTE — Telephone Encounter (Signed)
She has a new 5 cm left ovarian cyst.  She may start on OCP if she is not a smoker.  A GYN referral may be more appropriate since she needs a colpo.

## 2014-02-08 NOTE — Telephone Encounter (Signed)
-----   Message from Ambrose FinlandValerie A Keck, NP sent at 02/07/2014  1:58 PM EST ----- Patient has ovarian cyst. May start OCP to help with growth of cyst

## 2014-02-08 NOTE — Telephone Encounter (Signed)
Left voice message to return call 

## 2014-02-13 ENCOUNTER — Telehealth: Payer: Self-pay | Admitting: Internal Medicine

## 2014-02-13 NOTE — Telephone Encounter (Signed)
Patient came into facility to speak to nurse, please f/u with pt.

## 2014-02-17 HISTORY — DX: Maternal care for unspecified type scar from previous cesarean delivery: O34.219

## 2014-02-17 NOTE — L&D Delivery Note (Signed)
Patient is 34 y.o. G2P1001 [redacted]w[redacted]d admitted in SOL, hx of C-section. Progressed normally and had AROM at 8.5 cm after which she progressed appropriately to complete. While pushing infant had prolonged decel to high 70s with moderate variability. Attending Dr. Macon Large was called to the room given need for operative vaginal delivery. The patient was told of the need to use vacuum to help with delivery of infant through the use of an interpreter. Patient did not have any regional anesthesia.   Indication for operative vaginal delivery:  Prolonged deceleration at 2+ station Patient gave verbal consent.  Patient was examined and found to be fully dilated with fetal station of +2. The soft vacuum soft cup was positioned over the sagittal suture 3 cm anterior to posterior fontanelle.  Pressure was then increased to 500 mmHg, and the patient was instructed to push.  Pulling was administered along the pelvic curve.  3 pulls total were performed with one popoff.  The infant was then delivered atraumatically, noted to be a viable female infant who was handed to the awaiting neonatology team. Apgars of 8 and 9, weight was 5 pounds 9.8 ounces.  There was spontaneous placental delivery, intact with three-vessel cord.   Patient had a C-shaped laceration from the right clitoral hood to the right labia as well as a midline 1st degree perineal.  Local anesthesia, total of 30mL was injected into the length of the defect. Patient was also given Fentanyl 100 mcg to help with analgesia.   Sponge, instrument and needle counts were correct x2.  The patient and baby were stable after delivery.  Delivery Note At 3:46 PM a viable female was delivered via Vaginal, Vacuum (Extractor) (Presentation: ; Occiput Anterior).  APGAR: 8, 9; weight 5 lb 9.8 oz (2546 g).   Placenta status: Intact, Spontaneous.  Cord: 3 vessels with the following complications: prolonged deceleration that necessitated delivery via vacuum.  FHR was in the 80-90s for  approximately 10 minutes total.  Anesthesia: None  Episiotomy: None Lacerations:  Right periurethral, Right periclitoral and Right labial. 1st degree perineal Suture Repair: 4.0 Vicyrl on SH for Periclitoral and periurethral. 3.0 for 1st degree Est. Blood Loss (mL):   Mom to postpartum.  Baby to Couplet care / Skin to Skin.  Isa Rankin [facility] 09/22/2014, 4:30 PM

## 2014-02-28 ENCOUNTER — Ambulatory Visit: Payer: Self-pay | Admitting: Internal Medicine

## 2014-03-07 ENCOUNTER — Encounter: Payer: Self-pay | Admitting: Internal Medicine

## 2014-03-07 ENCOUNTER — Ambulatory Visit: Payer: Self-pay | Attending: Internal Medicine | Admitting: Internal Medicine

## 2014-03-07 ENCOUNTER — Inpatient Hospital Stay (HOSPITAL_COMMUNITY)
Admission: AD | Admit: 2014-03-07 | Discharge: 2014-03-07 | Disposition: A | Discharge status: Home / Self Care | Payer: Self-pay | Source: Ambulatory Visit | Attending: Obstetrics and Gynecology | Admitting: Obstetrics and Gynecology

## 2014-03-07 ENCOUNTER — Inpatient Hospital Stay (HOSPITAL_COMMUNITY): Payer: Self-pay

## 2014-03-07 ENCOUNTER — Encounter (HOSPITAL_COMMUNITY): Payer: Self-pay | Admitting: *Deleted

## 2014-03-07 VITALS — BP 107/63 | HR 65 | Temp 98.3°F | Resp 16 | Ht 60.0 in | Wt 127.0 lb

## 2014-03-07 DIAGNOSIS — R0602 Shortness of breath: Secondary | ICD-10-CM | POA: Insufficient documentation

## 2014-03-07 DIAGNOSIS — O26899 Other specified pregnancy related conditions, unspecified trimester: Secondary | ICD-10-CM

## 2014-03-07 DIAGNOSIS — R109 Unspecified abdominal pain: Secondary | ICD-10-CM

## 2014-03-07 DIAGNOSIS — R110 Nausea: Secondary | ICD-10-CM

## 2014-03-07 DIAGNOSIS — Z524 Kidney donor: Secondary | ICD-10-CM | POA: Insufficient documentation

## 2014-03-07 DIAGNOSIS — K219 Gastro-esophageal reflux disease without esophagitis: Secondary | ICD-10-CM | POA: Insufficient documentation

## 2014-03-07 DIAGNOSIS — Z3490 Encounter for supervision of normal pregnancy, unspecified, unspecified trimester: Secondary | ICD-10-CM

## 2014-03-07 DIAGNOSIS — O3481 Maternal care for other abnormalities of pelvic organs, first trimester: Secondary | ICD-10-CM | POA: Insufficient documentation

## 2014-03-07 DIAGNOSIS — N8320 Unspecified ovarian cysts: Secondary | ICD-10-CM | POA: Insufficient documentation

## 2014-03-07 DIAGNOSIS — Z331 Pregnant state, incidental: Secondary | ICD-10-CM | POA: Insufficient documentation

## 2014-03-07 DIAGNOSIS — O99611 Diseases of the digestive system complicating pregnancy, first trimester: Secondary | ICD-10-CM | POA: Insufficient documentation

## 2014-03-07 DIAGNOSIS — M545 Low back pain: Secondary | ICD-10-CM | POA: Insufficient documentation

## 2014-03-07 DIAGNOSIS — R1032 Left lower quadrant pain: Secondary | ICD-10-CM | POA: Insufficient documentation

## 2014-03-07 DIAGNOSIS — N189 Chronic kidney disease, unspecified: Secondary | ICD-10-CM | POA: Insufficient documentation

## 2014-03-07 DIAGNOSIS — E039 Hypothyroidism, unspecified: Secondary | ICD-10-CM | POA: Insufficient documentation

## 2014-03-07 DIAGNOSIS — Z3A09 9 weeks gestation of pregnancy: Secondary | ICD-10-CM | POA: Insufficient documentation

## 2014-03-07 DIAGNOSIS — O9989 Other specified diseases and conditions complicating pregnancy, childbirth and the puerperium: Secondary | ICD-10-CM

## 2014-03-07 DIAGNOSIS — R102 Pelvic and perineal pain: Secondary | ICD-10-CM | POA: Insufficient documentation

## 2014-03-07 LAB — CBC
HEMATOCRIT: 35.9 % — AB (ref 36.0–46.0)
Hemoglobin: 12.7 g/dL (ref 12.0–15.0)
MCH: 29.7 pg (ref 26.0–34.0)
MCHC: 35.4 g/dL (ref 30.0–36.0)
MCV: 84.1 fL (ref 78.0–100.0)
Platelets: 226 10*3/uL (ref 150–400)
RBC: 4.27 MIL/uL (ref 3.87–5.11)
RDW: 13.8 % (ref 11.5–15.5)
WBC: 6.8 10*3/uL (ref 4.0–10.5)

## 2014-03-07 LAB — WET PREP, GENITAL
CLUE CELLS WET PREP: NONE SEEN
Trich, Wet Prep: NONE SEEN
Yeast Wet Prep HPF POC: NONE SEEN

## 2014-03-07 LAB — POCT URINALYSIS DIPSTICK
Bilirubin, UA: NEGATIVE
Glucose, UA: NEGATIVE
Ketones, UA: 15
LEUKOCYTES UA: NEGATIVE
Nitrite, UA: NEGATIVE
Protein, UA: NEGATIVE
RBC UA: NEGATIVE
Spec Grav, UA: >=1.03
UROBILINOGEN UA: 0.2
pH, UA: 6.5

## 2014-03-07 LAB — HCG, QUANTITATIVE, PREGNANCY: hCG, Beta Chain, Quant, S: 145001 m[IU]/mL — ABNORMAL HIGH (ref <5)

## 2014-03-07 LAB — POCT URINE PREGNANCY: PREG TEST UR: POSITIVE

## 2014-03-07 MED ORDER — GI COCKTAIL ~~LOC~~
30.0000 mL | Freq: Once | ORAL | Status: AC
  Administered 2014-03-07: 30 mL via ORAL
  Filled 2014-03-07: qty 30

## 2014-03-07 MED ORDER — FAMOTIDINE 20 MG PO TABS: 20.0000 mg | ORAL_TABLET | Freq: Two times a day (BID) | ORAL | Status: DC

## 2014-03-07 NOTE — MAU Note (Signed)
Sent up from clinic. Having pressure in upper abd, pain in LLQ.Joanne [name].  Feels chills, very cold every day.  Has not checked temp. No vomiting or diarrhea, has been nauseated. No one at home is sick

## 2014-03-07 NOTE — Progress Notes (Signed)
Pt is here today wanting to get tested for pregnancy. Pt states that she has some nausea and pain in her abdomen.

## 2014-03-07 NOTE — Progress Notes (Addendum)
Patient ID: Joanne MinisterIsabel [name], female   DOB: 06/10/1980, 34 y.o.   MRN: [id number]  CC: nausea, abdominal pain  HPI: Joanne [name] is a 34 y.o. female here today for a follow up visit.  Patient has past medical history of CKD and hypothyroidism.  Patient reports that her last menustrual period was 11/11.  She has been having nausea and vomiting for the past 1.5 months.   Had a positive pregnancy test.  Had pain on left side and having chills. She has some lower back pain.  She feels sensitive in her pelvis.  More pressure in her abdomen.  She states that the pain often becomes severe and it feels like someone is squeezing her organs. Some SOB.     Patient has No headache, No chest pain, No abdominal pain - No Nausea, No new weakness tingling or numbness, No Cough - SOB.  No Known Allergies Past Medical History  Diagnosis Date  . Chronic kidney disease     Pt has only one kidney, pt. donated a kidney. Pt. not having any problems  . Hypothyroid    Current Outpatient Prescriptions on File Prior to Visit  Medication Sig Dispense Refill  . Vitamin D, Ergocalciferol, (DRISDOL) 50000 UNITS CAPS capsule Take 1 capsule (50,000 Units total) by mouth every 7 (seven) days. (Patient not taking: Reported on 03/07/2014) 10 capsule 0   No current facility-administered medications on file prior to visit.   History reviewed. No pertinent family history. History   Social History  . Marital Status: Married    Spouse Name: N/A    Number of Children: N/A  . Years of Education: N/A   Occupational History  . Not on file.   Social History Main Topics  . Smoking status: Never Smoker   . Smokeless tobacco: Never Used  . Alcohol Use: No  . Drug Use: No  . Sexual Activity: Yes    Birth Control/ Protection: None   Other Topics Concern  . Not on file   Social History Narrative    Review of Systems: See HPI    Objective:   Filed Vitals:   03/07/14 1517  BP: 107/63  Pulse: 65  Temp: 98.3 F (36.8  C)  Resp: 16    Physical Exam  Cardiovascular: Normal rate, regular rhythm and normal heart sounds.   Pulmonary/Chest: Effort normal and breath sounds normal.  Abdominal: There is tenderness (LLQ/pelvic region). There is no rebound.  Skin: Skin is warm and dry.     Lab Results  Component Value Date   WBC 5.2 01/16/2014   HGB 13.2 01/16/2014   HCT 38.4 01/16/2014   MCV 82.6 01/16/2014   PLT 286 01/16/2014   Lab Results  Component Value Date   CREATININE 0.80 01/16/2014   BUN 10 01/16/2014   NA 137 01/16/2014   K 3.6 01/16/2014   CL 104 01/16/2014   CO2 27 01/16/2014    No results found for: HGBA1C Lipid Panel     Component Value Date/Time   CHOL 134 01/16/2014 1238   TRIG 48 01/16/2014 1238   HDL 59 01/16/2014 1238   CHOLHDL 2.3 01/16/2014 1238   VLDL 10 01/16/2014 1238   LDLCALC 65 01/16/2014 1238       Assessment and plan:   Rutherford Nailsabel was seen today for follow-up.  Diagnoses and associated orders for this visit:  Pelvic pain during pregnancy Will send patient to MAU to r/o cyst rupture, ectopic pregnancy  Pregnant  Nausea without vomiting - POCT  urine pregnancy - Urinalysis Dipstick   Return if symptoms worsen or fail to improve.  Due to language barrier, an interpreter was present during the history-taking and subsequent discussion (and for part of the physical exam) with this patient.       Holland Commons, NP-C [facility] [phone] 03/07/2014, 4:03 PM

## 2014-03-07 NOTE — Patient Instructions (Signed)
Virus del papiloma humano (Human Papillomavirus) El virus del Geneticist, molecularpapiloma humano (VPH) es la enfermedad de transmisin sexual (ETS) ms comn y es altamente contagiosa. Las infecciones por el virus del VPH causan verrugas y cncer en la parte externa del tero (el cuello), el canal del parto(vagina), la abertura del canal de parto (vulva), y el ano. Hay ms de 100 tipos de VPH. Cuatro tipos de VPH son los responsables de Data processing managerocasionar el 70% del cncer cervical. El noventa por ciento del cncer de la zona anal y las verrugas genitales se deben al virus VPH. A menos que tenga verrugas genitales que pueda ver o tocar, el VPH no ocasiona sntomas. Por lo tanto, las personas pueden estar infectadas por largos perodos de Chetektiempo y pasarlo a otras sin saberlo.  El VPH en el embarazo no suele causar problemas ni a la madre ni al beb. Si la madre tiene verrugas genitales, el beb rara vez se infecta. Cuando la infeccin por VPH parece ser precancerosa en el cuello del tero, la vagina o la vulva, la madre deber ser controlada cercanamente durante el Troyembarazo. Cualquier tratamiento necesario se realizar despus de nacido el beb. CAUSAS  Tener sexo sin proteccin Puede transmitirse practicando sexo vaginal, anal u oral.  Tener mltiples compaeros sexuales.  Tener un compaero sexual que tiene otros International aid/development workercompaeros sexuales.  Tener o haber tenido otras enfermedades de transmisin sexual. SNTOMAS  Mas del 90% de las personas que tienen VPH no pueden decir que tienen algn problema.  Verrugas como llagas en la garganta (por tener sexo oral).  Verrugas en la piel infectada o membranas mucosas.  Las Magazine features editorverrugas genitales pueden picar, arder o Geophysicist/field seismologistsangrar.  Las Civil Service fast streamerverrugas genitales pueden ser dolorosas o Geophysicist/field seismologistsangrar durante las relaciones sexuales. DIAGNSTICO  Las verrugas genitales pueden observarse a simple vista para el anlisis diagnstico.  Actualmente no hay anlisis aprobados para Administrator, Civil Servicedetectar el VPH en los  hombres.  En las mujeres, el Papanicolau puede detectar clulas infectadas con el VPH.  Se utiliza un dispositivo para ver el cuello del tero (colposcopa). La colposcopa se indica si el examen plvico o el Papanicolau no son normales.  Durante la colposcopa se saca una muestra de tejido (biopsia, TRATAMIENTO  Las verrugas genitales pueden tratarse con:  Podofilina, una aplicacin de una pasta a las Public affairs consultantverrugas.  Acido bicloroactico o tricloroactico en forma lquida.  Podofilox solucin o gel.  Imiquimod crema  Inyeccin de interfern.  Crioterapia, congelamiento de verrugas.  Tratamiento lser a las Public affairs consultantverrugas.  Electrocauterizacin para Medical sales representativedestruir las verrugas.  Remocin quirrgica de [employer] verrugas.  La infeccin en el crvix, la vagina o la vulva pueden tratarse con:  Crioterapia.  Lser.  Electrocauterizacin.  Remocin Barbadosquirrgica. El profesional que lo asiste lo controlar peridicamente una vez comenzado el Deltatratamiento. Esto se debe a que Microbiologistel VPH puede reaparecer y podra tener que tratarse nuevamente. INSTRUCCIONES PARA EL CUIDADO DOMICILIARIO  Siga las instrucciones del profesional que lo asiste con respecto al uso de medicamentos, Papanicolau y exmenes de seguimiento.  No toque o rasque las verrugas.  No intente tratar las verrugas genitales con el mismo medicamento que se [place] Islandsutiliza para las verrugas de las manos.  Comente con su compaero sexual acerca de su infeccin porque podra tambin Administratornecesitar un tratamiento.  No tenga relaciones sexuales mientras recibe el tratamiento.  Luego del tratamiento, utilice condones durante las relaciones sexuales para prevenir la reinfeccin.  Janie Morningenga un solo compaero sexual.  Janie Morningenga un compaero sexual que no tiene otros International aid/development workercompaeros sexuales.  Puede utilizar cremas  de venta libre para la picazn o irritacin. Consulte con el profesional que lo asiste antes de utilizarlos.  Slo tome medicamentos de Sales promotion account executive o  prescriptos para Primary school teacher, las molestias o bajar la fiebre segn las indicaciones de su mdico.  No se haga duchas vaginales ni utilice tampones durante el tratamiento del VPH. PREVENCIN  Converse con su mdico acerca de aplicarse la vacuna contra el VPH. Estas vacunas evitan las infecciones por el VPH y Management consultant. Se recomienda que la vacuna se aplique a varones y [employer] 9 y los [address] No tendr efecto si ya tiene el VPH y no se recomienda en mujeres embarazadas. Estas vacunas no se recomiendan en mujeres embarazadas.  Comunquese con el mdico si piensa que que est embarazada y [place] VPH.  Un Papanicolau se realiza para Consulting civil engineer cervical.  El primer Papanicolaou debe realizarse los 21 aos.  Dyne[place] 21 y los 29 aos debe repetirse [address]  Luego de los 30 aos, debe realizarse un Papanicolaou cada tres aos siempre que los 3 estudios anteriores sean normales.  Algunas mujeres sufren problemas mdicos que aumentan la probabilidad de Research officer, political party cervical. Consulte a su mdico acerca de estos problemas. Es muy importante que le informe a su mdico si aparecen nuevos problemas poco despus de su ltimo Papanicolaou. En estos casos, el mdico podr indicar que se realice el Papanicolaou con ms frecuencia.  Estas recomendaciones son las mismas para todas las mujeres hayan recibido o no la vacuna para el VPH (virus del papiloma humano).  Si le han realizado una histerectoma por un problema que no era cncer u otra enfermedad que podra causar cncer, ya no necesitar un Papanicolaou. Sin embargo, aunque ya no necesite un Papanicolau, es una buena idea Medical sales representative examen regular para asegurarse de que no hay otros problemas.  Si tiene entre 65 y 31 aos y ha tenido un Papanicolaou normal en los ltimos 10 aos, ya no ser Music therapist. Sin embargo, aunque ya no necesite un Papanicolau, es una buena idea Medical sales representative examen regular para  asegurarse de que no hay otros problemas.   Si ha recibido un tratamiento para Management consultant cervical una enfermedad podra causar cncer, necesitar realizar un Papanicolaou y controles durante al menos 20 aos de concluir el tratamiento  Si no ha tenido continuidad para [employer] Papanicolau, debern evaluarse nuevamente los factores de riesgo (como tener una nueva pareja sexual) para Chief Strategy Officer si deben [employer].  Algunas mujeres necesitarn realizarse estudios con ms frecuencia si tienen factores de riesgo para Management consultant cervical. SOLICITE ANTENCIN MDICA SI:  La piel de la zona tratada se vuelve roja, se hincha o duele.  La temperatura se eleva por encima de 38,9 C (102 F).  Siente un Engineer, maintenance (IT).  Siente bultos o protuberancias tipo granos en la zona genital.  Desarrolla una hemorragia vaginal o en la zona de tratamiento.  Tiene dolor durante las [employer]. Document Released: 05/22/2008 Document Revised: 04/28/2011 [facility] Patient Information 2015 [employer], [place]. This information is not intended to replace advice given to you by your health care provider. Make sure you discuss any questions you have with your health care provider.

## 2014-03-07 NOTE — MAU Provider Note (Signed)
History     CSN: [id number]  Arrival date and time: 03/07/14 1653   First Provider Initiated Contact with Patient 03/07/14 2021      No chief complaint on file.  HPI  Joanne [name] 34 y.o.G2P1001 [id number] presents to the MAU stating that she is having LLQ pain for several weeks and c/o epigastic pressure in upper stomach. She denies vaginal bleeding. States she was seen in the clinic this morning and had a positive pregnancy test. States she has had some nausea.      Past Medical History  Diagnosis Date  . Chronic kidney disease     Pt has only one kidney, pt. donated a kidney. Pt. not having any problems  . Hypothyroid     Past Surgical History  Procedure Laterality Date  . Donated kidney    . Cesarean section  12/23/2010    Procedure: CESAREAN SECTION;  Surgeon: Kathreen CosierBernard A Marshall, MD;  Location: [facility];  Service: Gynecology;  Laterality: N/A;    History reviewed. No pertinent family history.  History  Substance Use Topics  . Smoking status: Never Smoker   . Smokeless tobacco: Never Used  . Alcohol Use: No    Allergies: No Known Allergies  No prescriptions prior to admission    ROS Physical Exam   Blood pressure 99/62, pulse 18, temperature 98.4 F (36.9 C), temperature source Oral, resp. rate 18, height 5' (1.524 m), weight 126 lb 12.8 oz (57.516 kg), last menstrual period 12/28/2013, SpO2 100 %.  Physical Exam  Nursing note and vitals reviewed. Constitutional: She is oriented to person, place, and time and well-developed, well-nourished, and in no distress.  HENT:  Head: Normocephalic and atraumatic.  Neck: Normal range of motion.  Respiratory: Effort normal.  GI: Soft. She exhibits no distension and no mass. There is tenderness. There is no rebound and no guarding.  LLQ  Genitourinary: Vagina normal, uterus normal and cervix normal.  approx [redacted] week gestation  Musculoskeletal: Normal range of motion.  Neurological: She is alert and oriented to  person, place, and time.  Skin: Skin is warm and dry.     Physical Exam  Constitutional: She is oriented to person, place, and time and well-developed, well-nourished, and in no distress.  HENT:  Head: Normocephalic and atraumatic.  Neck: Normal range of motion.  Pulmonary/Chest: Effort normal.  Abdominal: Soft. She exhibits no distension and no mass. There is tenderness. There is no rebound and no guarding.  LLQ  Genitourinary: Vagina normal, uterus normal and cervix normal.  approx [redacted] week gestation  Musculoskeletal: Normal range of motion.  Neurological: She is alert and oriented to person, place, and time.  Skin: Skin is warm and dry.  Nursing note and vitals reviewed.  Results for orders placed or performed during the hospital encounter of 03/07/14 (from the past 24 hour(s))  Wet prep, genital     Status: Abnormal   Collection Time: 03/07/14  7:58 PM  Result Value Ref Range   Yeast Wet Prep HPF POC NONE SEEN NONE SEEN   Trich, Wet Prep NONE SEEN NONE SEEN   Clue Cells Wet Prep HPF POC NONE SEEN NONE SEEN   WBC, Wet Prep HPF POC MODERATE (A) NONE SEEN  CBC     Status: Abnormal   Collection Time: 03/07/14  8:01 PM  Result Value Ref Range   WBC 6.8 4.0 - 10.5 K/uL   RBC 4.27 3.87 - 5.11 MIL/uL   Hemoglobin 12.7 12.0 - 15.0 g/dL  HCT 35.9 (L) 36.0 - 46.0 %   MCV 84.1 78.0 - 100.0 fL   MCH 29.7 26.0 - 34.0 pg   MCHC 35.4 30.0 - 36.0 g/dL   RDW 40.9 81.1 - 91.4 %   Platelets 226 150 - 400 K/uL  hCG, quantitative, pregnancy     Status: Abnormal   Collection Time: 03/07/14  8:01 PM  Result Value Ref Range   hCG, Beta Chain, Quant, S 145001 (H) <5 mIU/mL   US Ob Comp Less 14 Wks  03/07/2014   CLINICAL DATA:  Left lower quadrant pain during early pregnancy. History of left ovarian cyst.  EXAM: OBSTETRIC <14 WK ULTRASOUND  TECHNIQUE: Transabdominal ultrasound was performed for evaluation of the gestation as well as the maternal uterus and adnexal regions.  COMPARISON:  Pelvic  ultrasound on 01/24/2014  FINDINGS: Intrauterine gestational sac: Visualized/normal in shape.  Yolk sac:  Present.  Embryo:  Single embryo present.  Cardiac Activity: Regular cardiac activity detected.  Heart Rate: 158 bpm  MSD:   mm    w     d  CRL:   28.1  mm   9 w 4 d                  [place] EDC: 10/06/2014  Maternal uterus/adnexae: No evidence of subchorionic hemorrhage. Simple appearing cyst of the left ovary has enlarged since the prior ultrasound with current dimensions of approximately 6.5 x 5.5 x 7.0 cm. This still appears to be an uncomplicated appearing cyst by ultrasound with no complex features identified. The right ovary was visualized and normal in appearance. No free fluid is identified in the pelvis.  IMPRESSION: 1. Viable intrauterine gestation with estimated gestational age of [redacted] weeks and 4 days by ultrasound. Ultrasound EDC is 10/06/2014. 2. Enlargement of simple cyst of the left ovary with maximal diameter increased from approximately 5 cm on the prior ultrasound up to 7 cm currently.   Electronically Signed   By: Irish Lack M.D.   On: 03/07/2014 21:28    MAU Course  Procedures  MDM Spec Exam Ultrasound Hospital Interpreter used for translation   Assessment and Plan  R/O Ectopic  Lori Clemmons CNM  A: SIUP at [redacted]w[redacted]d Simple ovarian cyst GERD  P: Discharge home Rx for Pepcid sent to patient's pharmacy Patient also advised to TUMs are appropriate for GERD symptoms, but not to be taken at the same time as Pepcid Patient given pregnancy confirmation letter and list of OB providers Advised to start prenatal care ASAP First trimester precautions discussed Patient may return to MAU as needed or if her condition were to change or worsen   Marny Lowenstein, PA-C  03/08/2014, 1:40 AM

## 2014-03-07 NOTE — MAU Note (Signed)
Urine in lab 

## 2014-03-07 NOTE — Discharge Instructions (Signed)
First Trimester of Pregnancy The first trimester of pregnancy is from week 1 until the end of week 12 (months 1 through 3). During this time, your baby will begin to develop inside you. At 6-8 weeks, the eyes and face are formed, and the heartbeat can be seen on ultrasound. At the end of 12 weeks, all the baby's organs are formed. Prenatal care is all the medical care you receive before the birth of your baby. Make sure you get good prenatal care and follow all of your doctor's instructions. HOME CARE  Medicines  Take medicine only as told by your doctor. Some medicines are safe and some are not during pregnancy.  Take your prenatal vitamins as told by your doctor.  Take medicine that helps you poop (stool softener) as needed if your doctor says it is okay. Diet  Eat regular, healthy meals.  Your doctor will tell you the amount of weight gain that is right for you.  Avoid raw meat and uncooked cheese.  If you feel sick to your stomach (nauseous) or throw up (vomit):  Eat 4 or 5 small meals a day instead of 3 large meals.  Try eating a few soda crackers.  Drink liquids between meals instead of during meals.  If you have a hard time pooping (constipation):  Eat high-fiber foods like fresh vegetables, fruit, and whole grains.  Drink enough fluids to keep your pee (urine) clear or pale yellow. Activity and Exercise  Exercise only as told by your doctor. Stop exercising if you have cramps or pain in your lower belly (abdomen) or low back.  Try to avoid standing for long periods of time. Move your legs often if you must stand in one place for a long time.  Avoid heavy lifting.  Wear low-heeled shoes. Sit and stand up straight.  You can have sex unless your doctor tells you not to. Relief of Pain or Discomfort  Wear a good support bra if your breasts are sore.  Take warm water baths (sitz baths) to soothe pain or discomfort caused by hemorrhoids. Use hemorrhoid cream if your  doctor says it is okay.  Rest with your legs raised if you have leg cramps or low back pain.  Wear support hose if you have puffy, bulging veins (varicose veins) in your legs. Raise (elevate) your feet for 15 minutes, 3-4 times a day. Limit salt in your diet. Prenatal Care  Schedule your prenatal visits by the twelfth week of pregnancy.  Write down your questions. Take them to your prenatal visits.  Keep all your prenatal visits as told by your doctor. Safety  Wear your seat belt at all times when driving.  Make a list of emergency phone numbers. The list should include numbers for family, friends, the hospital, and police and fire departments. General Tips  Ask your doctor for a referral to a local prenatal class. Begin classes no later than at the start of month 6 of your pregnancy.  Ask for help if you need counseling or help with nutrition. Your doctor can give you advice or tell you where to go for help.  Do not use hot tubs, steam rooms, or saunas.  Do not douche or use tampons or scented sanitary pads.  Do not cross your legs for long periods of time.  Avoid litter boxes and soil used by cats.  Avoid all smoking, herbs, and alcohol. Avoid drugs not approved by your doctor.  Visit your dentist. At home, brush your teeth   with a soft toothbrush. Be gentle when you floss. GET HELP IF:  You are dizzy.  You have mild cramps or pressure in your lower belly.  You have a nagging pain in your belly area.  You continue to feel sick to your stomach, throw up, or have watery poop (diarrhea).  You have a bad smelling fluid coming from your vagina.  You have pain with peeing (urination).  You have increased puffiness (swelling) in your face, hands, legs, or ankles. GET HELP RIGHT AWAY IF:   You have a fever.  You are leaking fluid from your vagina.  You have spotting or bleeding from your vagina.  You have very bad belly cramping or pain.  You gain or lose weight  rapidly.  You throw up blood. It may look like coffee grounds.  You are around people who have German measles, fifth disease, or chickenpox.  You have a very bad headache.  You have shortness of breath.  You have any kind of trauma, such as from a fall or a car accident. Document Released: 07/23/2007 Document Revised: 06/20/2013 Document Reviewed: 12/14/2012 [employer] Patient Information 2015 [employer]. This information is not intended to replace advice given to you by your health care provider. Make sure you discuss any questions you have with your health care provider.  

## 2014-03-08 LAB — GC/CHLAMYDIA PROBE AMP (~~LOC~~) NOT AT ARMC
Chlamydia: NEGATIVE
NEISSERIA GONORRHEA: NEGATIVE

## 2014-03-20 ENCOUNTER — Inpatient Hospital Stay (HOSPITAL_COMMUNITY)
Admission: AD | Admit: 2014-03-20 | Discharge: 2014-03-20 | Disposition: A | Discharge status: Home / Self Care | Payer: Self-pay | Source: Ambulatory Visit | Attending: Obstetrics and Gynecology | Admitting: Obstetrics and Gynecology

## 2014-03-20 ENCOUNTER — Encounter (HOSPITAL_COMMUNITY): Payer: Self-pay | Admitting: *Deleted

## 2014-03-20 DIAGNOSIS — R109 Unspecified abdominal pain: Secondary | ICD-10-CM | POA: Insufficient documentation

## 2014-03-20 DIAGNOSIS — O26831 Pregnancy related renal disease, first trimester: Secondary | ICD-10-CM | POA: Insufficient documentation

## 2014-03-20 DIAGNOSIS — Z3A11 11 weeks gestation of pregnancy: Secondary | ICD-10-CM | POA: Insufficient documentation

## 2014-03-20 DIAGNOSIS — N898 Other specified noninflammatory disorders of vagina: Secondary | ICD-10-CM | POA: Insufficient documentation

## 2014-03-20 DIAGNOSIS — O26851 Spotting complicating pregnancy, first trimester: Secondary | ICD-10-CM

## 2014-03-20 DIAGNOSIS — N189 Chronic kidney disease, unspecified: Secondary | ICD-10-CM | POA: Insufficient documentation

## 2014-03-20 LAB — URINE MICROSCOPIC-ADD ON

## 2014-03-20 LAB — URINALYSIS, ROUTINE W REFLEX MICROSCOPIC
Bilirubin Urine: NEGATIVE
GLUCOSE, UA: NEGATIVE mg/dL
Ketones, ur: 15 mg/dL — AB
NITRITE: NEGATIVE
PROTEIN: NEGATIVE mg/dL
SPECIFIC GRAVITY, URINE: 1.02 (ref 1.005–1.030)
UROBILINOGEN UA: 0.2 mg/dL (ref 0.0–1.0)
pH: 7 (ref 5.0–8.0)

## 2014-03-20 LAB — WET PREP, GENITAL
Clue Cells Wet Prep HPF POC: NONE SEEN
Trich, Wet Prep: NONE SEEN
Yeast Wet Prep HPF POC: NONE SEEN

## 2014-03-20 NOTE — MAU Provider Note (Signed)
None     Chief Complaint:  Abdominal Pain and Vaginal Discharge   Joanne [name] is  34 y.o. G2P1001 at 5354w5d presents complaining of Abdominal Pain and Vaginal Discharge She has had a brown and mucousy discharge for 5 days.  Also c/o lower abdominal craming "like I'm going to start my period".  Last intercourse 3 weeks ago.   Obstetrical/Gynecological History: OB History    Gravida Para Term Preterm AB TAB SAB Ectopic Multiple Living   2 1 1       1      Past Medical History: Past Medical History  Diagnosis Date  . Hypothyroid   . Chronic kidney disease     Pt has only one kidney, pt. donated a kidney. Pt. not having any problems    Past Surgical History: Past Surgical History  Procedure Laterality Date  . Donated kidney    . Cesarean section  12/23/2010    Procedure: CESAREAN SECTION;  Surgeon: Kathreen CosierBernard A Marshall, MD;  Location: [facility];  Service: Gynecology;  Laterality: N/A;    Family History: Family History  Problem Relation Age of Onset  . Diabetes Maternal Aunt     Social History: History  Substance Use Topics  . Smoking status: Never Smoker   . Smokeless tobacco: Never Used  . Alcohol Use: No    Allergies: No Known Allergies  Meds:  Prescriptions prior to admission  Medication Sig Dispense Refill Last Dose  . famotidine (PEPCID) 20 MG tablet Take 1 tablet (20 mg total) by mouth 2 (two) times daily. 30 tablet 0 03/19/2014 at Unknown time    Review of Systems   Constitutional: Negative for fever and chills Eyes: Negative for visual disturbances Respiratory: Negative for shortness of breath, dyspnea Cardiovascular: Negative for chest pain or palpitations  Gastrointestinal: Negative for vomiting, diarrhea and constipation Genitourinary: Negative for dysuria and urgency Musculoskeletal: Negative for back pain, joint pain, myalgias  Neurological: Negative for dizziness and headaches     Physical Exam  Blood pressure 114/59, pulse 59, temperature  98 F (36.7 C), temperature source Oral, resp. rate 18, height 5' (1.524 m), weight 56.427 kg (124 lb 6.4 oz), last menstrual period 12/28/2013. GENERAL: Well-developed, well-nourished female in no acute distress.  LUNGS: Clear to auscultation bilaterally.  HEART: Regular rate and rhythm. ABDOMEN: Soft, nontender, nondistended, gravid.  EXTREMITIES: Nontender, no edema, 2+ distal pulses. DTR's 2+ SSE:  Small amount of brown mucousy discharge. Cx non friable.   CERVICAL EXAM: Dilatation 0cm   Effacement 0%    FHT: 160 doppler  Labs: Results for orders placed or performed during the hospital encounter of 03/20/14 (from the past 24 hour(s))  Urinalysis, Routine w reflex microscopic   Collection Time: 03/20/14  6:36 PM  Result Value Ref Range   Color, Urine YELLOW YELLOW   APPearance CLEAR CLEAR   Specific Gravity, Urine 1.020 1.005 - 1.030   pH 7.0 5.0 - 8.0   Glucose, UA NEGATIVE NEGATIVE mg/dL   Hgb urine dipstick MODERATE (A) NEGATIVE   Bilirubin Urine NEGATIVE NEGATIVE   Ketones, ur 15 (A) NEGATIVE mg/dL   Protein, ur NEGATIVE NEGATIVE mg/dL   Urobilinogen, UA 0.2 0.0 - 1.0 mg/dL   Nitrite NEGATIVE NEGATIVE   Leukocytes, UA SMALL (A) NEGATIVE  Urine microscopic-add on   Collection Time: 03/20/14  6:36 PM  Result Value Ref Range   Squamous Epithelial / LPF RARE RARE   WBC, UA 3-6 <3 WBC/hpf   RBC / HPF 0-2 <3 RBC/hpf  Bacteria, UA FEW (A) RARE   Urine-Other AMORPHOUS URATES/PHOSPHATES   Wet prep, genital   Collection Time: 03/20/14  8:56 PM  Result Value Ref Range   Yeast Wet Prep HPF POC NONE SEEN NONE SEEN   Trich, Wet Prep NONE SEEN NONE SEEN   Clue Cells Wet Prep HPF POC NONE SEEN NONE SEEN   WBC, Wet Prep HPF POC MANY (A) NONE SEEN     Assessment: Joanne [name] is  34 y.o. G2P1001 at [redacted]w[redacted]d presents with spotting, no s/s of infection or threatened SAB.  Plan: Hydrate more to help with cramping.  CRESENZO-DISHMAN,Lakyra Tippins 2/1/20169:18 PM

## 2014-03-20 NOTE — Discharge Instructions (Signed)
Hemorragia vaginal durante el embarazo (primer trimestre) °(Vaginal Bleeding During Pregnancy, First Trimester) °Durante los primeros meses del embarazo es relativamente frecuente que se presente una pequeña hemorragia (manchas). Esta situación generalmente mejora por sí misma. Estas hemorragias o manchas tienen diversas causas al inicio del embarazo. Algunas manchas pueden estar relacionadas al embarazo y otras no. En la mayoría de los casos, la hemorragia es normal y no es un problema. Sin embargo, la hemorragia también puede ser un signo de algo grave. Debe informar a su médico de inmediato si tiene alguna hemorragia vaginal. °Algunas causas posibles de hemorragia vaginal durante el primer trimestre incluyen: °· Infección o inflamación del cuello del útero. °· Crecimientos anormales (pólipos) en el cuello del útero. °· Aborto espontáneo o amenaza de aborto espontáneo. °· Tejido del embarazo se ha desarrollado fuera del útero y en una trompa de falopio (embarazo ectópico). °· Se han desarrollado pequeños quistes en el útero en lugar de tejido de embarazo (embarazo molar). °INSTRUCCIONES PARA EL CUIDADO EN EL HOGAR  °Controle su afección para ver si hay cambios. Las siguientes indicaciones ayudarán a aliviar cualquier molestia que pueda sentir: °· Siga las indicaciones del médico para restringir su actividad. Si el médico le indica descanso en la cama, debe quedarse en la cama y levantarse solo para ir al baño. No obstante, el médico puede permitirle que continué con tareas livianas. °· Si es necesario, organícese para que alguien le ayude con las actividades y responsabilidades cotidianas mientras está en cama. °· Lleve un registro de la cantidad y la saturación de las toallas higiénicas que utiliza cada día. Anote este dato. °· No use tampones.No se haga duchas vaginales. °· No tenga relaciones sexuales u orgasmos hasta que el médico la autorice. °· Si elimina tejido por la vagina, guárdelo para mostrárselo al  médico. °· Tome solo medicamentos de venta libre o recetados, según las indicaciones del médico. °· No tome aspirina, ya que puede causar hemorragias. °· Cumpla con todas las visitas de control, según le indique su médico. °SOLICITE ATENCIÓN MÉDICA SI: °· Tiene un sangrado vaginal en cualquier momento del embarazo. °· Tiene calambres o dolores de parto. °· Tiene fiebre que los medicamentos no pueden controlar. °SOLICITE ATENCIÓN MÉDICA DE INMEDIATO SI:  °· Siente calambres intensos en la espalda o en el vientre (abdomen). °· Elimina coágulos grandes o tejido por la vagina. °· La hemorragia aumenta. °· Si se siente mareada, débil o se desmaya. °· Tiene escalofríos. °· Tiene una pérdida importante o sale líquido a borbotones por la vagina. °· Se desmaya mientras defeca. °ASEGÚRESE DE QUE: °· Comprende estas instrucciones. °· Controlará su afección. °· Recibirá ayuda de inmediato si no mejora o si empeora. °Document Released: 11/13/2004 Document Revised: 02/08/2013 °ExitCare® Patient Information ©2015 [employer]. This information is not intended to replace advice given to you by your health care provider. Make sure you discuss any questions you have with your health care provider. ° °

## 2014-03-20 NOTE — MAU Note (Signed)
Pt presents complaining of brown discharge x5 days. Pt also having lower abdominal and hip pain.

## 2014-03-20 NOTE — Progress Notes (Signed)
I was present during the pelvic examen with Despina HickFran CNM. Also I assisted Rayfield CitizenCaroline with some questions in triage., by Orlan LeavensViria Alvarez Spanish Interpreter

## 2014-04-06 ENCOUNTER — Other Ambulatory Visit (HOSPITAL_COMMUNITY): Payer: Self-pay | Admitting: Nurse Practitioner

## 2014-04-06 DIAGNOSIS — Z3689 Encounter for other specified antenatal screening: Secondary | ICD-10-CM

## 2014-05-09 ENCOUNTER — Ambulatory Visit (HOSPITAL_COMMUNITY)
Admission: RE | Admit: 2014-05-09 | Discharge: 2014-05-09 | Disposition: A | Discharge status: Home / Self Care | Payer: Medicaid Other | Source: Ambulatory Visit | Attending: Nurse Practitioner | Admitting: Nurse Practitioner

## 2014-05-09 DIAGNOSIS — Z3689 Encounter for other specified antenatal screening: Secondary | ICD-10-CM

## 2014-05-09 DIAGNOSIS — Z36 Encounter for antenatal screening of mother: Secondary | ICD-10-CM | POA: Insufficient documentation

## 2014-05-18 ENCOUNTER — Telehealth: Payer: Self-pay

## 2014-05-18 NOTE — Telephone Encounter (Signed)
MEDICAL RECORDS FAXED TO [facility]OVE ON 05/18/14

## 2014-08-16 ENCOUNTER — Other Ambulatory Visit: Payer: Self-pay | Admitting: Certified Nurse Midwife

## 2014-08-28 ENCOUNTER — Encounter (HOSPITAL_COMMUNITY): Payer: Self-pay | Admitting: *Deleted

## 2014-08-28 ENCOUNTER — Inpatient Hospital Stay (HOSPITAL_COMMUNITY): Payer: Medicaid Other

## 2014-08-28 ENCOUNTER — Inpatient Hospital Stay (HOSPITAL_COMMUNITY)
Admission: AD | Admit: 2014-08-28 | Discharge: 2014-08-28 | Disposition: A | Discharge status: Home / Self Care | Payer: Medicaid Other | Source: Ambulatory Visit | Attending: Obstetrics & Gynecology | Admitting: Obstetrics & Gynecology

## 2014-08-28 DIAGNOSIS — O4703 False labor before 37 completed weeks of gestation, third trimester: Secondary | ICD-10-CM

## 2014-08-28 DIAGNOSIS — O26833 Pregnancy related renal disease, third trimester: Secondary | ICD-10-CM | POA: Insufficient documentation

## 2014-08-28 DIAGNOSIS — N189 Chronic kidney disease, unspecified: Secondary | ICD-10-CM | POA: Insufficient documentation

## 2014-08-28 DIAGNOSIS — O365930 Maternal care for other known or suspected poor fetal growth, third trimester, not applicable or unspecified: Secondary | ICD-10-CM | POA: Insufficient documentation

## 2014-08-28 DIAGNOSIS — Z3A34 34 weeks gestation of pregnancy: Secondary | ICD-10-CM | POA: Insufficient documentation

## 2014-08-28 LAB — URINALYSIS, ROUTINE W REFLEX MICROSCOPIC
Bilirubin Urine: NEGATIVE
Glucose, UA: NEGATIVE mg/dL
HGB URINE DIPSTICK: NEGATIVE
KETONES UR: NEGATIVE mg/dL
Leukocytes, UA: NEGATIVE
NITRITE: NEGATIVE
PH: 7 (ref 5.0–8.0)
PROTEIN: NEGATIVE mg/dL
SPECIFIC GRAVITY, URINE: 1.02 (ref 1.005–1.030)
UROBILINOGEN UA: 0.2 mg/dL (ref 0.0–1.0)

## 2014-08-28 LAB — OB RESULTS CONSOLE GBS: GBS: POSITIVE

## 2014-08-28 NOTE — Discharge Instructions (Signed)
Informacin sobre el parto prematuro  (Preterm Labor Information) El parto prematuro comienza antes de la semana 37 de embarazo. La duracin de un embarazo normal es de 39 a 41 semanas.  CAUSAS  Generalmente no hay una causa que pueda identificarse del motivo por el que una mujer comienza un trabajo de parto prematuro. Sin embargo, una de las causas conocidas ms frecuentes son las infecciones. Las infecciones del tero, el cuello, la vagina, el lquido amnitico, la vejiga, los riones y hasta de los pulmones (neumona) pueden hacer que el trabajo de parto se inicie. Otras causas que pueden sospecharse son:   Infecciones urogenitales, como infecciones por hongos y vaginosis bacteriana.   Anormalidades uterinas (forma del tero, sptum uterino, fibromas, hemorragias en la placenta).   Un cuello que ha sido operado (puede ser que no permanezca cerrado).   Malformaciones del feto.   Gestaciones mltiples (mellizos, trillizos y ms).   Ruptura del saco amnitico.  FACTORES DE RIESGO   Historia previa de parto prematuro.   Tener ruptura prematura de las membranas (RPM).   La placenta cubre la abertura del cuello (placenta previa).   La placenta se separa del tero (abrupcin placentaria).   El cuello es demasiado dbil para contener al beb en el tero (cuello incompetente).   Hay mucho lquido en el saco amnitico (polihidramnios).   Consumo de drogas o hbito de fumar durante el embarazo.   No aumentar de peso lo suficiente durante el embarazo.   Mujeres menores de 18 aos o mayores de 35 aos.   Nivel socioeconmico bajo.   Pertenecer a la raza afroamericana. SNTOMAS  Los signos y sntomas del trabajo de parto prematuro son:   Clicos similares a los menstruales, dolor abdominal o dolor de espalda.  Contracciones uterinas regulares, tan frecuentes como seis por hora, sin importar su intensidad (pueden ser suaves o dolorosas).  Contracciones que comienzan  en la parte superior del tero y se expanden hacia abajo, a la zona inferior del abdomen y la espalda.   Sensacin de aumento de presin en la pelvis.   Aparece una secrecin acuosa o sanguinolenta por la vagina.  TRATAMIENTO  Segn el tiempo del embarazo y otras circunstancias, el mdico puede indicar reposo en cama. Si es necesario, le indicarn medicamentos para detener las contracciones y para madurar los pulmones del feto. Si el trabajo de parto se inicia antes de las 34 semanas de embarazo, se recomienda la hospitalizacin. El tratamiento depende de las condiciones en que se encuentren usted y el feto.  QU DEBE HACER SI PIENSA QUE EST EN TRABAJO DE PARTO PREMATURO?  Comunquese con su mdico inmediatamente. Debe concurrir al hospital para ser controlada inmediatamente.  CMO PUEDE EVITAR EL TRABAJO DE PARTO PREMATURO EN FUTUROS EMBARAZOS?  Usted debe:   Si fuma, abandonar el hbito.  Mantener un peso saludable y evitar sustancias qumicas y drogas innecesarias.  Controlar todo tipo de infeccin.  Informe a su mdico si tiene una historia conocida de trabajo de parto prematuro. Document Released: 05/13/2007 Document Revised: 10/06/2012 ExitCare Patient Information 2015 [employer]. This information is not intended to replace advice given to you by your health care provider. Make sure you discuss any questions you have with your health care provider.  

## 2014-08-28 NOTE — MAU Note (Signed)
[facility] denies bleeding or LOF.  Is feeling very tired & lightheaded.

## 2014-08-28 NOTE — Progress Notes (Signed)
Reviewed D/C instrs with Bonnye FavaViria, interpretor present.

## 2014-08-28 NOTE — MAU Provider Note (Signed)
History     CSN: [id number]  Arrival date and time: 08/28/14 1324   First Provider Initiated Contact with Patient 08/28/14 1435      Chief Complaint  Patient presents with  . Contractions   HPI(communicated via Spanish interpreter): [name] is a 34 year old, G2P1001 that is 34wk5 days that presents today after she starting having contractions that started 3am this morning. She says that the contractions were more frequent this morning and have slowed down since. She has pain in her lower abdomen and lower back that is a 7/10. Denies any vaginal discharge or vaginal bleeding, nausea, vomiting, constipation, or diarrhea. Endorses lightheadness, and a slight headache that started at noon.  OB History    Gravida Para Term Preterm AB TAB SAB Ectopic Multiple Living   2 1 1       1       Past Medical History  Diagnosis Date  . Hypothyroid   . Chronic kidney disease     Pt has only one kidney, pt. donated a kidney. Pt. not having any problems    Past Surgical History  Procedure Laterality Date  . Donated kidney    . Cesarean section  12/23/2010    Procedure: CESAREAN SECTION;  Surgeon: Kathreen CosierBernard A Marshall, MD;  Location: [facility];  Service: Gynecology;  Laterality: N/A;    Family History  Problem Relation Age of Onset  . Diabetes Maternal Aunt     History  Substance Use Topics  . Smoking status: Never Smoker   . Smokeless tobacco: Never Used  . Alcohol Use: No    Allergies: No Known Allergies  Prescriptions prior to admission  Medication Sig Dispense Refill Last Dose  . ferrous sulfate 325 (65 FE) MG tablet Take 325 mg by mouth daily with breakfast.   08/27/2014 at Unknown time  . Prenatal Vit-Fe Fumarate-FA (PRENATAL MULTIVITAMIN) TABS tablet Take 1 tablet by mouth daily at 12 noon.       Review of Systems  HENT:       Slight headache.  Respiratory: Negative for shortness of breath.   Cardiovascular: Negative for chest pain.  Gastrointestinal: Positive for  abdominal pain. Negative for nausea and vomiting.  Genitourinary: Negative for dysuria, urgency and frequency.  Musculoskeletal: Positive for back pain.  Neurological: Positive for dizziness.       Endorses some lightheadness.   Physical Exam   Blood pressure 91/51, pulse 63, temperature 98.2 F (36.8 C), temperature source Oral, resp. rate 18, last menstrual period 12/28/2013.  Physical Exam  Constitutional: She is oriented to person, place, and time. She appears well-developed.  HENT:  Head: Normocephalic and atraumatic.  Eyes: EOM are normal.  Cardiovascular: Normal rate, regular rhythm and normal heart sounds.  Exam reveals no gallop and no friction rub.   No murmur heard. Respiratory: Effort normal and breath sounds normal. No respiratory distress.  GI: There is no tenderness.  Musculoskeletal: She exhibits no edema.  Neurological: She is alert and oriented to person, place, and time.  Skin: Skin is warm and dry.  Psychiatric: She has a normal mood and affect. Her behavior is normal. Judgment and thought content normal.    MAU Course  Procedures   Closed thick high FHT's- Cat 1 UC's- rare MDM Normal AFI and interval growth.  Assessment and Plan  Assessment: Joanne [name] is a 34 yo, G2P1001, 3077w5d that presents today with abdominal pain due to contractions with an onset of 3am this morning and a decrease contraction  frequency since this morning. She also has some associated lower back pain. Preterm Contractions Small for dates   Plan: Discharge to home Preterm labor precautions Keep regularly scheduled appt  [name] 08/28/2014, 2:47 PM

## 2014-09-22 ENCOUNTER — Inpatient Hospital Stay (HOSPITAL_COMMUNITY)
Admission: EM | Admit: 2014-09-22 | Discharge: 2014-09-24 | DRG: 774 | Disposition: A | Discharge status: Home / Self Care | Payer: Medicaid Other | Attending: Obstetrics & Gynecology | Admitting: Obstetrics & Gynecology

## 2014-09-22 ENCOUNTER — Encounter (HOSPITAL_COMMUNITY): Payer: Self-pay | Admitting: *Deleted

## 2014-09-22 DIAGNOSIS — O3421 Maternal care for scar from previous cesarean delivery: Secondary | ICD-10-CM | POA: Diagnosis present

## 2014-09-22 DIAGNOSIS — O99824 Streptococcus B carrier state complicating childbirth: Secondary | ICD-10-CM | POA: Diagnosis present

## 2014-09-22 DIAGNOSIS — O99284 Endocrine, nutritional and metabolic diseases complicating childbirth: Secondary | ICD-10-CM | POA: Diagnosis present

## 2014-09-22 DIAGNOSIS — Z3A38 38 weeks gestation of pregnancy: Secondary | ICD-10-CM | POA: Diagnosis present

## 2014-09-22 DIAGNOSIS — O9962 Diseases of the digestive system complicating childbirth: Secondary | ICD-10-CM | POA: Diagnosis present

## 2014-09-22 DIAGNOSIS — O34219 Maternal care for unspecified type scar from previous cesarean delivery: Secondary | ICD-10-CM | POA: Diagnosis present

## 2014-09-22 DIAGNOSIS — N189 Chronic kidney disease, unspecified: Secondary | ICD-10-CM | POA: Diagnosis present

## 2014-09-22 DIAGNOSIS — O26833 Pregnancy related renal disease, third trimester: Secondary | ICD-10-CM | POA: Diagnosis present

## 2014-09-22 DIAGNOSIS — K219 Gastro-esophageal reflux disease without esophagitis: Secondary | ICD-10-CM | POA: Diagnosis present

## 2014-09-22 DIAGNOSIS — E039 Hypothyroidism, unspecified: Secondary | ICD-10-CM | POA: Diagnosis present

## 2014-09-22 DIAGNOSIS — O99280 Endocrine, nutritional and metabolic diseases complicating pregnancy, unspecified trimester: Secondary | ICD-10-CM | POA: Diagnosis present

## 2014-09-22 DIAGNOSIS — Z833 Family history of diabetes mellitus: Secondary | ICD-10-CM

## 2014-09-22 LAB — TYPE AND SCREEN
ABO/RH(D): O POS
Antibody Screen: NEGATIVE

## 2014-09-22 LAB — CBC
HCT: 33.7 % — ABNORMAL LOW (ref 36.0–46.0)
Hemoglobin: 11.5 g/dL — ABNORMAL LOW (ref 12.0–15.0)
MCH: 28.1 pg (ref 26.0–34.0)
MCHC: 34.1 g/dL (ref 30.0–36.0)
MCV: 82.4 fL (ref 78.0–100.0)
Platelets: 196 10*3/uL (ref 150–400)
RBC: 4.09 MIL/uL (ref 3.87–5.11)
RDW: 16.4 % — ABNORMAL HIGH (ref 11.5–15.5)
WBC: 8.8 10*3/uL (ref 4.0–10.5)

## 2014-09-22 MED ORDER — ONDANSETRON HCL 4 MG/2ML IJ SOLN: 4.0000 mg | INTRAMUSCULAR | Status: DC | PRN

## 2014-09-22 MED ORDER — LACTATED RINGERS IV SOLN
INTRAVENOUS | Status: DC
  Administered 2014-09-22: 09:00:00 via INTRAVENOUS

## 2014-09-22 MED ORDER — OXYCODONE-ACETAMINOPHEN 5-325 MG PO TABS: 2.0000 | ORAL_TABLET | ORAL | Status: DC | PRN

## 2014-09-22 MED ORDER — TRAMADOL HCL 50 MG PO TABS
25.0000 mg | ORAL_TABLET | Freq: Four times a day (QID) | ORAL | Status: DC
  Administered 2014-09-22 – 2014-09-24 (×7): 25 mg via ORAL
  Filled 2014-09-22 (×7): qty 1

## 2014-09-22 MED ORDER — PHENYLEPHRINE 40 MCG/ML (10ML) SYRINGE FOR IV PUSH (FOR BLOOD PRESSURE SUPPORT): 80.0000 ug | PREFILLED_SYRINGE | INTRAVENOUS | Status: DC | PRN

## 2014-09-22 MED ORDER — DIPHENHYDRAMINE HCL 25 MG PO CAPS: 25.0000 mg | ORAL_CAPSULE | Freq: Four times a day (QID) | ORAL | Status: DC | PRN

## 2014-09-22 MED ORDER — ACETAMINOPHEN 325 MG PO TABS
650.0000 mg | ORAL_TABLET | ORAL | Status: DC | PRN
  Administered 2014-09-22 – 2014-09-23 (×2): 650 mg via ORAL
  Filled 2014-09-22 (×2): qty 2

## 2014-09-22 MED ORDER — FENTANYL CITRATE (PF) 100 MCG/2ML IJ SOLN
100.0000 ug | INTRAMUSCULAR | Status: DC | PRN
  Administered 2014-09-22 (×3): 100 ug via INTRAVENOUS
  Filled 2014-09-22 (×3): qty 2

## 2014-09-22 MED ORDER — ACETAMINOPHEN 325 MG PO TABS: 650.0000 mg | ORAL_TABLET | ORAL | Status: DC | PRN

## 2014-09-22 MED ORDER — DOCUSATE SODIUM 100 MG PO CAPS
100.0000 mg | ORAL_CAPSULE | Freq: Two times a day (BID) | ORAL | Status: DC
  Administered 2014-09-22 – 2014-09-24 (×4): 100 mg via ORAL
  Filled 2014-09-22 (×4): qty 1

## 2014-09-22 MED ORDER — LACTATED RINGERS IV SOLN: 500.0000 mL | INTRAVENOUS | Status: DC | PRN

## 2014-09-22 MED ORDER — SIMETHICONE 80 MG PO CHEW: 80.0000 mg | CHEWABLE_TABLET | ORAL | Status: DC | PRN

## 2014-09-22 MED ORDER — OXYTOCIN BOLUS FROM INFUSION: 500.0000 mL | INTRAVENOUS | Status: DC

## 2014-09-22 MED ORDER — DIBUCAINE 1 % RE OINT
1.0000 "application " | TOPICAL_OINTMENT | RECTAL | Status: DC | PRN
  Administered 2014-09-22: 1 via RECTAL
  Filled 2014-09-22: qty 28

## 2014-09-22 MED ORDER — LANOLIN HYDROUS EX OINT: TOPICAL_OINTMENT | CUTANEOUS | Status: DC | PRN

## 2014-09-22 MED ORDER — IBUPROFEN 600 MG PO TABS
600.0000 mg | ORAL_TABLET | Freq: Four times a day (QID) | ORAL | Status: DC
  Filled 2014-09-22: qty 1

## 2014-09-22 MED ORDER — WITCH HAZEL-GLYCERIN EX PADS
1.0000 "application " | MEDICATED_PAD | CUTANEOUS | Status: DC | PRN
  Administered 2014-09-22: 1 via TOPICAL

## 2014-09-22 MED ORDER — ONDANSETRON HCL 4 MG/2ML IJ SOLN: 4.0000 mg | Freq: Four times a day (QID) | INTRAMUSCULAR | Status: DC | PRN

## 2014-09-22 MED ORDER — OXYTOCIN 40 UNITS IN LACTATED RINGERS INFUSION - SIMPLE MED: 62.5000 mL/h | INTRAVENOUS | Status: DC | PRN

## 2014-09-22 MED ORDER — BENZOCAINE-MENTHOL 20-0.5 % EX AERO
1.0000 "application " | INHALATION_SPRAY | CUTANEOUS | Status: DC | PRN
  Administered 2014-09-22: 1 via TOPICAL
  Filled 2014-09-22: qty 56

## 2014-09-22 MED ORDER — SODIUM CHLORIDE 0.9 % IV SOLN
2.0000 g | Freq: Once | INTRAVENOUS | Status: AC
  Administered 2014-09-22: 2 g via INTRAVENOUS
  Filled 2014-09-22: qty 2000

## 2014-09-22 MED ORDER — CITRIC ACID-SODIUM CITRATE 334-500 MG/5ML PO SOLN
30.0000 mL | ORAL | Status: DC | PRN
  Filled 2014-09-22: qty 15

## 2014-09-22 MED ORDER — LIDOCAINE HCL (PF) 1 % IJ SOLN
30.0000 mL | INTRAMUSCULAR | Status: DC | PRN
  Filled 2014-09-22: qty 30

## 2014-09-22 MED ORDER — ONDANSETRON HCL 4 MG PO TABS: 4.0000 mg | ORAL_TABLET | ORAL | Status: DC | PRN

## 2014-09-22 MED ORDER — OXYTOCIN 40 UNITS IN LACTATED RINGERS INFUSION - SIMPLE MED
62.5000 mL/h | INTRAVENOUS | Status: DC
  Filled 2014-09-22: qty 1000

## 2014-09-22 MED ORDER — OXYCODONE-ACETAMINOPHEN 5-325 MG PO TABS
1.0000 | ORAL_TABLET | ORAL | Status: DC | PRN
  Administered 2014-09-22: 1 via ORAL
  Filled 2014-09-22: qty 1

## 2014-09-22 MED ORDER — DIPHENHYDRAMINE HCL 50 MG/ML IJ SOLN: 12.5000 mg | INTRAMUSCULAR | Status: DC | PRN

## 2014-09-22 MED ORDER — FENTANYL 2.5 MCG/ML BUPIVACAINE 1/10 % EPIDURAL INFUSION (WH - ANES): 14.0000 mL/h | INTRAMUSCULAR | Status: DC | PRN

## 2014-09-22 MED ORDER — OXYCODONE-ACETAMINOPHEN 5-325 MG PO TABS: 1.0000 | ORAL_TABLET | ORAL | Status: DC | PRN

## 2014-09-22 MED ORDER — EPHEDRINE 5 MG/ML INJ: 10.0000 mg | INTRAVENOUS | Status: DC | PRN

## 2014-09-22 MED ORDER — PRENATAL MULTIVITAMIN CH
1.0000 | ORAL_TABLET | Freq: Every day | ORAL | Status: DC
  Administered 2014-09-23 – 2014-09-24 (×2): 1 via ORAL
  Filled 2014-09-22 (×2): qty 1

## 2014-09-22 NOTE — Progress Notes (Signed)
Labor Progress Note  S: Feeling strong contractions. Coping well with birthing  O:  BP 107/68 mmHg  Pulse 67  Temp(Src) 98 F (36.7 C) (Oral)  Resp 18  Ht 5' (1.524 m)  Wt 146 lb (66.225 kg)  BMI 28.51 kg/m2  LMP 12/28/2013 (Exact Date) EFM: 125/mod/+accels/no decels CVE: Dilation:  (Most of cervix on pt's right side; encouraged pos. change ) Effacement (%): 100 Cervical Position: Middle Station: -2 Presentation: Vertex Exam by:: Shaquan Missey   A&P: 34 y.o. G2P1001 [redacted]w[redacted]d in active labor with history of TOLAC #Labor: AROM performed with clear fluid. Continued to monitor. No need for additional augmentation at this point #FWB: Cat I #GBS POS- s/p PCN, continue PCN #TOLAC- consented and progressing normally.   Federico Flake, MD 3:04 PM

## 2014-09-22 NOTE — MAU Note (Signed)
uc's intermittent since yesterday, became regular @ 0400 this a.m.  Denies bleeding or LOF.  C/S with first baby, planning VBAC.

## 2014-09-22 NOTE — Progress Notes (Signed)
Informed of temperature of 100.8 after SVD at 1546 today.  No other focal symptoms or signs. Will administer Tylenol, continue close observation.  Tereso Newcomer, MD 09/22/2014 9:40 PM

## 2014-09-22 NOTE — H&P (Signed)
LABOR ADMISSION HISTORY AND PHYSICAL  Joanne [name] is a 34 y.o. female G2P1001 with IUP at [redacted]w[redacted]d by LMP 12/28/13 presenting for regular uterine contractions.  Pt had c-section 2/2 failure to progress with prior delivery. Desiring TOLAC, has signed consent.  Was followed at [facility].  Patient is Spanish-speaking only, Spanish interpreter present for this encounter.  She reports +FMs, No LOF, no VB, no blurry vision, headaches or peripheral edema, and RUQ pain.  She plans on breast feeding. She requests mirena for birth control.  Dating: By LMP 12/28/13 --->  Estimated Date of Delivery: 10/04/14  Ultrasounds:  05/09/14 US OB comp +14 weeks @[redacted]w[redacted]d , CWD, normal anatomy, variable presentation,  234g, 34%tile EFW Other U/S this pregnancy (see imaging tab): -03/07/14 US OB comp less than 14 weeks -08/28/14 US OB follow up: @[redacted]w[redacted]d  by LMP, cephalic presentation, placenta anterior and above cervical os, EFW at 2399gm, 53%tile, appropriate fetal growth,  size < dates, no previa, normal amniotic fluid volume   Prenatal History/Complications:  Patient Active Problem List   Diagnosis Date Noted  . Maternal anemia complicating pregnancy, childbirth, or the puerperium 12/25/2010  . Previous cesarean delivery, antepartum condition or complication 12/24/2010  . Hypothyroidism complicating pregnancy 12/23/2010    Past Medical History: Past Medical History  Diagnosis Date  . Hypothyroid   . Chronic kidney disease     Pt has only one kidney, pt. donated a kidney. Pt. not having any problems    Past Surgical History: Past Surgical History  Procedure Laterality Date  . Donated kidney    . Cesarean section  12/23/2010    Procedure: CESAREAN SECTION;  Surgeon: Kathreen Cosier, MD;  Location: [facility];  Service: Gynecology;  Laterality: N/A;    Obstetrical History: OB History    Gravida Para Term Preterm AB TAB SAB Ectopic Multiple Living   2 1 1       1       Social History: History   Social  History  . Marital Status: Married    Spouse Name: N/A  . Number of Children: N/A  . Years of Education: N/A   Social History Main Topics  . Smoking status: Never Smoker   . Smokeless tobacco: Never Used  . Alcohol Use: No  . Drug Use: No  . Sexual Activity: Yes    Birth Control/ Protection: None   Other Topics Concern  . None   Social History Narrative    Family History: Family History  Problem Relation Age of Onset  . Diabetes Maternal Aunt     Allergies: No Known Allergies  Prescriptions prior to admission  Medication Sig Dispense Refill Last Dose  . diphenhydrAMINE (BENADRYL) 25 MG tablet Take 25 mg by mouth every 6 (six) hours as needed for itching or allergies.   09/08/2014  . Prenatal Vit-Fe Fumarate-FA (PRENATAL MULTIVITAMIN) TABS tablet Take 1 tablet by mouth daily at 12 noon.   09/21/2014 at Unknown time  . [DISCONTINUED] ferrous sulfate 325 (65 FE) MG tablet Take 325 mg by mouth daily with breakfast.   08/27/2014 at Unknown time    Review of Systems  All systems reviewed and negative except as stated in HPI  Physical Exam Blood pressure 101/63, pulse 82, temperature 98.8 F (37.1 C), temperature source Oral, resp. rate 20, last menstrual period 12/28/2013. General appearance: alert, cooperative, appears stated age and no distress Lungs: clear to auscultation bilaterally Heart: regular rate and rhythm Abdomen: soft, non-tender; bowel sounds normal, gravid uterus Pelvic: deferred to Dr. Alvester Morin  who reports 8.5/100/-2 at 11:45am Extremities: No sign of DVT Presentation: cephalic Fetal monitoringBaseline: 140 bpm, Variability: Good {> 6 bpm), Accelerations: Reactive and Decelerations: Absent Uterine activityFrequency: Every 5-6 minutes Dilation: 7.5 Effacement (%): 70 Station: -2 Exam by:: Dorrene German RN   Prenatal labs: ABO, Rh: --/--/O POS (08/05 0900) Antibody: NEG (08/05 0900) Rubella:   Immune RPR: NON REAC (11/30 1238)  HBsAg:   Neg HIV: NONREACTIVE  (11/30 1238), NR 04/06/14 and 07/13/14 per [facility] HD records  GBS: Positive (07/11 0000)  1 hr Glucola None recorded  Genetic screening: Quad screen negative 05/09/14, HD records Anatomy US 05/09/14 normal, f/u US on 7/11 showed size < dates  Prenatal Transfer Tool  Maternal Diabetes: No Genetic Screening: Normal Maternal Ultrasounds/Referrals: Normal Fetal Ultrasounds or other Referrals:  None Maternal Substance Abuse:  No Significant Maternal Medications:  None Significant Maternal Lab Results: Lab values include: Group B Strep positive  Results for orders placed or performed during the hospital encounter of 09/22/14 (from the past 24 hour(s))  CBC   Collection Time: 09/22/14  9:00 AM  Result Value Ref Range   WBC 8.8 4.0 - 10.5 K/uL   RBC 4.09 3.87 - 5.11 MIL/uL   Hemoglobin 11.5 (L) 12.0 - 15.0 g/dL   HCT 47.8 (L) 29.5 - 62.1 %   MCV 82.4 78.0 - 100.0 fL   MCH 28.1 26.0 - 34.0 pg   MCHC 34.1 30.0 - 36.0 g/dL   RDW 30.8 (H) 65.7 - 84.6 %   Platelets 196 150 - 400 K/uL  Type and screen   Collection Time: 09/22/14  9:00 AM  Result Value Ref Range   ABO/RH(D) O POS    Antibody Screen NEG    Sample Expiration 09/25/2014     Patient Active Problem List   Diagnosis Date Noted  . Indication for care in labor or delivery 09/22/2014  . Small for gestational age   . [redacted] weeks gestation of pregnancy   . GERD (gastroesophageal reflux disease) 10/26/2012  . Dysuria 10/26/2012  . Maternal anemia complicating pregnancy, childbirth, or the puerperium 12/25/2010  . Cesarean delivery, without mention of indication, delivered, with or without mention of antepartum condition 12/24/2010  . Hypothyroidism complicating pregnancy 12/23/2010  . Early/threatened labor 12/23/2010    Assessment: Joanne [name] is a 34 y.o. G2P1001 at [redacted]w[redacted]d here for uterine contractions and per report, 7-8cm dilated.    #Labor: Expectant management #Pain: IV pain medication, will request epidural  if needed #FWB:  Baseline HR 145, moderate variability, accelerations present, no decelerations.  Category 1 strip #ID:  GBS prophylaxis with 2g Ampicillin initiated #MOF:  Breast #MOC:  Mirena #Circ:   Unknown  The above information was documented with assistance from Retta Mac, MS3.     Jaynie Collins, MD, FACOG Attending Obstetrician & Gynecologist Faculty Practice, [facility]

## 2014-09-22 NOTE — Plan of Care (Deleted)
Shela Commons, RN assuming care of pt while this RN assisting another patient with epidural.

## 2014-09-22 NOTE — Progress Notes (Signed)
Erline Hau RNC resassuming care.

## 2014-09-22 NOTE — Progress Notes (Signed)
mdnotified of temp/inst to give pt tylenol

## 2014-09-22 NOTE — Plan of Care (Signed)
Shela Commons, RN assuming care of pt while this RN assisting another pt with epidural.

## 2014-09-23 DIAGNOSIS — Z3A38 38 weeks gestation of pregnancy: Secondary | ICD-10-CM

## 2014-09-23 DIAGNOSIS — O3421 Maternal care for scar from previous cesarean delivery: Secondary | ICD-10-CM

## 2014-09-23 DIAGNOSIS — O26833 Pregnancy related renal disease, third trimester: Secondary | ICD-10-CM

## 2014-09-23 DIAGNOSIS — N189 Chronic kidney disease, unspecified: Secondary | ICD-10-CM

## 2014-09-23 DIAGNOSIS — O99824 Streptococcus B carrier state complicating childbirth: Secondary | ICD-10-CM

## 2014-09-23 LAB — HEMOGLOBIN AND HEMATOCRIT, BLOOD
HCT: 25.8 % — ABNORMAL LOW (ref 36.0–46.0)
HEMOGLOBIN: 8.8 g/dL — AB (ref 12.0–15.0)

## 2014-09-23 LAB — RPR: RPR Ser Ql: NONREACTIVE

## 2014-09-23 NOTE — Lactation Note (Signed)
This note was copied from the chart of Joanne [name]. Lactation Consultation Note Initial visit at 25 hours of age with spanish interpreter.  Mom is experienced with breatfeeding.  Mom reports this baby is doing well and denies pain.  Mom denies concerns at this time.  [facility] LC resources given and discussed.  Encouraged to feed with early cues on demand.  Early newborn behavior discussed.  Hand expression demonstrated by mom with colostrum visible.  Mom is already hand expression pre and post feedings.    Mom to call for assist as needed.    Patient Name: Joanne Aanshi Batchelder [id number] Date: 09/23/2014 Reason for consult: Initial assessment   Maternal Data Has patient been taught Hand Expression?: Yes Does the patient have breastfeeding experience prior to this delivery?: Yes  Feeding Feeding Type: Breast Fed Length of feed: 30 min  LATCH Score/Interventions                Intervention(s): Breastfeeding basics reviewed     Lactation Tools Discussed/Used WIC Program: Yes   Consult Status Consult Status: Follow-up Date: 09/24/14 Follow-up type: In-patient    Shoptaw, Arvella Merles 09/23/2014, 5:39 PM

## 2014-09-23 NOTE — Progress Notes (Signed)
Post Partum Day 1 Subjective:  Joanne [name] is a 34 y.o. [id number] [redacted]w[redacted]d s/p SVD.  No acute events overnight.  Pt denies problems with ambulating, voiding or po intake.  She denies nausea or vomiting.  Pain is well controlled.  She has had flatus. She has not had bowel movement.  Lochia Minimal.  Plan for birth control is IUD.  Method of Feeding: breast.   Objective: Blood pressure 112/66, pulse 86, temperature 99.1 F (37.3 C), temperature source Oral, resp. rate 20, height 5' (1.524 m), weight 66.225 kg (146 lb), last menstrual period 12/28/2013, unknown if currently breastfeeding.  Physical Exam:  General: alert, cooperative and no distress Lochia:normal flow Chest: CTAB Heart: RRR no m/r/g Abdomen: +BS, soft, nontender,  Uterine Fundus: firm, nontender DVT Evaluation: No evidence of DVT seen on physical exam. Extremities: no edema   Recent Labs  09/22/14 0900 09/23/14 0530  HGB 11.5* 8.8*  HCT 33.7* 25.8*    Assessment/Plan:  ASSESSMENT: Joanne [name] is a 34 y.o. [id number] [redacted]w[redacted]d s/p SVD w/ 850cc EBL. This morning she is asymptomatic, though her Hg dropped from 11.5 to 8.8.  Plan for discharge tomorrow and Breastfeeding   LOS: 1 day   [name] 09/23/2014, 7:20 AM

## 2014-09-23 NOTE — Progress Notes (Signed)
Checked on patients needs.  °Spanish Interpreter  °

## 2014-09-23 NOTE — Progress Notes (Signed)
Assisted RN Lactation with interpretation of breastfeeding instructions.  °Spanish Interpreter  °

## 2014-09-23 NOTE — Progress Notes (Signed)
Checked on patients needs.  Ordered patients breakfast. °Spanish Interpreter  °

## 2014-09-24 MED ORDER — ACETAMINOPHEN 325 MG PO TABS: 650.0000 mg | ORAL_TABLET | ORAL | Status: DC | PRN

## 2014-09-24 MED ORDER — TRAMADOL HCL 50 MG PO TABS: 25.0000 mg | ORAL_TABLET | Freq: Four times a day (QID) | ORAL | Status: DC

## 2014-09-24 NOTE — Lactation Note (Addendum)
This note was copied from the chart of Joanne Meleni Delahunt. Lactation Consultation Note  Mother called to view latch.  Baby latches easily, sucks and swallows observed. Encouraged mother to massage breast to keep him active.  LS10. Reminder mother to wake baby if he does wake after 3 hours and encouraged STS. Keep monitoring voids/stools and call for further questions.  Patient Name: Joanne [name] [id number] Date: 09/24/2014 Reason for consult: Follow-up assessment   Maternal Data    Feeding Feeding Type: Breast Fed Length of feed: 30 min  LATCH Score/Interventions Latch: Grasps breast easily, tongue down, lips flanged, rhythmical sucking.  Audible Swallowing: Spontaneous and intermittent Intervention(s): Skin to skin;Alternate breast massage  Type of Nipple: Everted at rest and after stimulation  Comfort (Breast/Nipple): Soft / non-tender     Hold (Positioning): No assistance needed to correctly position infant at breast.  LATCH Score: 10  Lactation Tools Discussed/Used     Consult Status Consult Status: Complete Date: 09/25/14 Follow-up type: In-patient    Dahlia Byes [facility] 09/24/2014, 10:35 AM

## 2014-09-24 NOTE — Discharge Instructions (Signed)

## 2014-09-24 NOTE — Lactation Note (Signed)
This note was copied from the chart of Joanne Dealva Lafoy. Lactation Consultation Note  Spanish Interpreter present. Left LC phone number and suggest mother call to view next feeding prior to discharge. Baby recently breastfed for 30 min.  Encouraged STS and undressing to diaper w/ hat on for feedings. Reviewed supply and demand, engorgement care and monitoring voids/stools. Mom encouraged to feed baby 8-12 times/24 hours and with feeding cues.  Mother denies concerns or questions.  Patient Name: Joanne [name] Date: 09/24/2014 Reason for consult: Follow-up assessment   Maternal Data    Feeding Feeding Type: Breast Fed Length of feed: 30 min  LATCH Score/Interventions                      Lactation Tools Discussed/Used     Consult Status Consult Status: Follow-up Date: 09/25/14 Follow-up type: In-patient    Dahlia Byes [facility] 09/24/2014, 9:57 AM

## 2014-09-24 NOTE — Discharge Summary (Signed)
Obstetric Discharge Summary Reason for Admission: onset of labor Prenatal Procedures: none Intrapartum Procedures: spontaneous vaginal delivery and vacuum Postpartum Procedures: none Complications-Operative and Postpartum: hemorrhage  Patient is 34 y.o. G2P1001 [redacted]w[redacted]d admitted in SOL, hx of C-section. Progressed normally and had AROM at 8.5 cm after which she progressed appropriately to complete. While pushing infant had prolonged decel to high 70s with moderate variability. Attending Dr. Macon Large was called to the room given need for operative vaginal delivery. The patient was told of the need to use vacuum to help with delivery of infant through the use of an interpreter. Patient did not have any regional anesthesia.   Indication for operative vaginal delivery: Prolonged deceleration at 2+ station Patient gave verbal consent.  Patient was examined and found to be fully dilated with fetal station of +2. The soft vacuum soft cup was positioned over the sagittal suture 3 cm anterior to posterior fontanelle. Pressure was then increased to 500 mmHg, and the patient was instructed to push. Pulling was administered along the pelvic curve. 3 pulls total were performed with one popoff. The infant was then delivered atraumatically, noted to be a viable female infant who was handed to the awaiting neonatology team. Apgars of 8 and 9, weight was 5 pounds 9.8 ounces. There was spontaneous placental delivery, intact with three-vessel cord. Patient had a C-shaped laceration from the right clitoral hood to the right labia as well as a midline 1st degree perineal. Local anesthesia, total of 30mL was injected into the length of the defect. Patient was also given Fentanyl 100 mcg to help with analgesia. Sponge, instrument and needle counts were correct x2. The patient and baby were stable after delivery.  Delivery Note At 3:46 PM a viable female was delivered via Vaginal, Vacuum (Extractor) (Presentation: ;  Occiput Anterior). APGAR: 8, 9; weight 5 lb 9.8 oz (2546 g).  Placenta status: Intact, Spontaneous. Cord: 3 vessels with the following complications: prolonged deceleration that necessitated delivery via vacuum. FHR was in the 80-90s for approximately 10 minutes total.  Anesthesia: None  Episiotomy: None Lacerations: Right periurethral, Right periclitoral and Right labial. 1st degree perineal Suture Repair: 4.0 [employer] on SH for Periclitoral and periurethral. 3.0 for 1st degree Est. Blood Loss (mL):  Mom to postpartum. Baby to Couplet care / Skin to Skin.  Hospital Course:  Principal Problem:   VBAC (vaginal birth after Cesarean) Active Problems:   Hypothyroidism complicating pregnancy   Previous cesarean delivery, antepartum condition or complication   Indication for care in labor or delivery   Postpartum hemorrhage   Joanne [name] is a 34 y.o. [id number] s/p VAVD/VBAC.  Patient was admitted 8/5.  She has postpartum course that was uncomplicated including no problems with ambulating, PO intake, urination, pain, or bleeding. The pt feels ready to go home and  will be discharged with outpatient follow-up.   Today: No acute events overnight.  Pt denies problems with ambulating, voiding or po intake.  She denies nausea or vomiting.  Pain is well controlled.  She has had flatus. She has not had bowel movement.  Lochia Minimal.  Plan for birth control is  IUD.  Method of Feeding: breast  Physical Exam:  General: alert, cooperative, appears stated age and no distress Lochia: appropriate Uterine Fundus: firm DVT Evaluation: No evidence of DVT seen on physical exam.  H/H: Lab Results  Component Value Date/Time   HGB 8.8* 09/23/2014 05:30 AM   HCT 25.8* 09/23/2014 05:30 AM    Discharge Diagnoses: Term Pregnancy-delivered  Discharge Information: Date: 09/24/2014 Activity: pelvic rest Diet: routine  Medications: PNV and tylenol Breast feeding:  Yes Condition:  stable Instructions: refer to handout Discharge to: home       Discharge Instructions    Discharge patient    Complete by:  As directed             Medication List    STOP taking these medications        diphenhydrAMINE 25 MG tablet  Commonly known as:  BENADRYL      TAKE these medications        acetaminophen 325 MG tablet  Commonly known as:  TYLENOL  Take 2 tablets (650 mg total) by mouth every 4 (four) hours as needed for moderate pain.     prenatal multivitamin Tabs tablet  Take 1 tablet by mouth daily at 12 noon.       Follow-up Information    Follow up with [facility]. Schedule an appointment as soon as possible for a visit in 6 weeks.   Specialty:  Home Health Services   Why:  for postpartum visit   Contact information:   Care Coordination for [facility] [address] [place] [address] [phone]       Joanne [name] ,MD 09/24/2014,7:32 AM

## 2015-03-15 ENCOUNTER — Ambulatory Visit (INDEPENDENT_AMBULATORY_CARE_PROVIDER_SITE_OTHER): Payer: Self-pay | Admitting: Physician Assistant

## 2015-03-15 ENCOUNTER — Ambulatory Visit (INDEPENDENT_AMBULATORY_CARE_PROVIDER_SITE_OTHER): Payer: Self-pay

## 2015-03-15 VITALS — BP 108/69 | HR 65 | Temp 98.2°F | Resp 20 | Ht 60.0 in | Wt 135.6 lb

## 2015-03-15 DIAGNOSIS — M25511 Pain in right shoulder: Secondary | ICD-10-CM

## 2015-03-15 DIAGNOSIS — M19011 Primary osteoarthritis, right shoulder: Secondary | ICD-10-CM

## 2015-03-15 NOTE — Progress Notes (Signed)
[facility] [address], [place] [address] [phone]  Date:  03/15/2015   Name:  Jennifermarie Franzen   DOB:  17-Mar-1980   MRN:  [id number]  PCP:  Ambrose Finland, NP    Chief Complaint: Spasms   History of Present Illness:  This is a 35 y.o. female with PMH hypothyroidism who is presenting with right shoulder pain x 1 week. Becoming more constant. Feels pain in the bone. Pain starts at anterior shoulder and is radiating into right side of neck. States neck feels warm. NKI. This has never happened before. She has a 50 month old baby. She is breast feeding. She is right hand dominant and admits to holding her baby in her arm a lot and breast feeding on the right side. She also tends to sleep on her right side. She denies shooting pain into arm. No numbness or weakness. She is not currently working. She wants to return to work soon. She works in Scientist, research (physical sciences) at [employer]. She has tried tylenol for pain and not helping - only taking 1 tab per day.  Pt only has 1 kidney.  Review of Systems:  Review of Systems See HPI  n Patient Active Problem List   Diagnosis Date Noted  . Hypothyroidism complicating pregnancy 12/23/2010    Prior to Admission medications   Not on File    Allergies  Allergen Reactions  . Nsaids Other (See Comments)    Patient with one kidney, avoid NSAIDS    Past Surgical History  Procedure Laterality Date  . Donated kidney    . Cesarean section  12/23/2010    Procedure: CESAREAN SECTION;  Surgeon: Kathreen Cosier, MD;  Location: [facility];  Service: Gynecology;  Laterality: N/A;    Social History  Substance Use Topics  . Smoking status: Never Smoker   . Smokeless tobacco: Never Used  . Alcohol Use: No    Family History  Problem Relation Age of Onset  . Diabetes Maternal Aunt     Medication list has been reviewed and updated.  Physical Examination:  Physical Exam  Constitutional: She is oriented to person, place, and  time. She appears well-developed and well-nourished. No distress.  HENT:  Head: Normocephalic and atraumatic.  Right Ear: Hearing normal.  Left Ear: Hearing normal.  Nose: Nose normal.  Eyes: Conjunctivae and lids are normal. Right eye exhibits no discharge. Left eye exhibits no discharge. No scleral icterus.  Cardiovascular: Normal rate, regular rhythm, normal heart sounds, intact distal pulses and normal pulses.   No murmur heard. Pulmonary/Chest: Effort normal and breath sounds normal. No respiratory distress. She has no wheezes. She has no rhonchi. She has no rales.  Musculoskeletal: Normal range of motion.       Right shoulder: She exhibits tenderness and bony tenderness (AC joint). She exhibits normal range of motion and no swelling.       Left shoulder: Normal.       Cervical back: She exhibits tenderness (right paraspinal, mild). She exhibits normal range of motion and no bony tenderness.  Pain with external rotation of right shoulder Pain with shoulder adduction, cross body No pain with shoulder abduction Mild pain with shoulder extension. Strength 5/5 Sensation intact.  Neurological: She is alert and oriented to person, place, and time.  Skin: Skin is warm, dry and intact. No lesion and no rash noted.  Psychiatric: She has a normal mood and affect. Her speech is normal and behavior is normal.  Thought content normal.    BP 108/69 mmHg  Pulse 65  Temp(Src) 98.2 F (36.8 C) (Oral)  Resp 20  Ht 5' (1.524 m)  Wt 135 lb 9.6 oz (61.508 kg)  BMI 26.48 kg/m2  SpO2 99%  LMP 03/02/2015  Breastfeeding? Yes   Dg Shoulder Right  03/15/2015  CLINICAL DATA:  Right shoulder pain for 1 week. No known injury. Initial encounter. EXAM: RIGHT SHOULDER - 2+ VIEW COMPARISON:  None. FINDINGS: There is no evidence of fracture or dislocation. There is no evidence of arthropathy or other focal bone abnormality. Soft tissues are unremarkable. IMPRESSION: Negative exam. Electronically Signed   By:  Drusilla Kanner M.D.   On: 03/15/2015 21:01    Assessment and Plan:  1. Pain in joint of right shoulder 2. Acromioclavicular arthrosis, right Radiograph normal. Suspect overuse injury. She will try to decrease her activity - hold baby in left arm and do not sleep on right side. Will treat with tylenol 1 gm TID -  Cannot treat with nsaids since only one kidney. She is breastfeeding. Discussed home exercises. Return if not better in 2 weeks - she would benefit from PT although pt does not have insurance at this time. - DG Shoulder Right; Future    Roswell Miners. Dyke Brackett, MHS [facility]  03/19/2015

## 2015-03-15 NOTE — Patient Instructions (Addendum)
Porque has recibido [employer], usted recibir una factura de [employer]. Pngase en contacto con [facility] en [phone] con preguntas o inquietudes acerca de su factura. Nuestro personal de facturacin no ser capaz de ayudarle con las preguntas  Tylenol 1000 mg tres veces en el dia. exercisios dos veces en el dia regresa si tu dolor no es muy buena [employer].

## 2015-03-19 ENCOUNTER — Encounter: Payer: Self-pay | Admitting: Physician Assistant

## 2015-03-29 DIAGNOSIS — I8390 Asymptomatic varicose veins of unspecified lower extremity: Secondary | ICD-10-CM | POA: Insufficient documentation

## 2015-04-07 DIAGNOSIS — E039 Hypothyroidism, unspecified: Secondary | ICD-10-CM | POA: Insufficient documentation

## 2018-04-20 ENCOUNTER — Ambulatory Visit (INDEPENDENT_AMBULATORY_CARE_PROVIDER_SITE_OTHER): Payer: Self-pay | Admitting: Primary Care

## 2018-04-20 ENCOUNTER — Telehealth: Payer: Self-pay | Admitting: General Practice

## 2018-04-20 ENCOUNTER — Encounter (INDEPENDENT_AMBULATORY_CARE_PROVIDER_SITE_OTHER): Payer: Self-pay | Admitting: Primary Care

## 2018-04-20 ENCOUNTER — Other Ambulatory Visit: Payer: Self-pay

## 2018-04-20 ENCOUNTER — Encounter: Payer: Self-pay | Admitting: General Practice

## 2018-04-20 VITALS — BP 108/72 | HR 69 | Temp 98.4°F | Ht 60.5 in | Wt 142.6 lb

## 2018-04-20 DIAGNOSIS — Z7689 Persons encountering health services in other specified circumstances: Secondary | ICD-10-CM

## 2018-04-20 DIAGNOSIS — N912 Amenorrhea, unspecified: Secondary | ICD-10-CM

## 2018-04-20 DIAGNOSIS — Z3490 Encounter for supervision of normal pregnancy, unspecified, unspecified trimester: Secondary | ICD-10-CM

## 2018-04-20 DIAGNOSIS — N946 Dysmenorrhea, unspecified: Secondary | ICD-10-CM

## 2018-04-20 DIAGNOSIS — Z3201 Encounter for pregnancy test, result positive: Secondary | ICD-10-CM

## 2018-04-20 DIAGNOSIS — Z524 Kidney donor: Secondary | ICD-10-CM

## 2018-04-20 DIAGNOSIS — E038 Other specified hypothyroidism: Secondary | ICD-10-CM

## 2018-04-20 LAB — POCT URINE PREGNANCY: PREG TEST UR: POSITIVE — AB

## 2018-04-20 NOTE — Addendum Note (Signed)
Addended by: Maryjean Morn on: 04/20/2018 09:47 AM   Modules accepted: Orders

## 2018-04-20 NOTE — Telephone Encounter (Signed)
Patient will be applying for pregnancy Medicaid.  Charity application and [employer] information given to patient.  Patient verbalized understanding.

## 2018-04-20 NOTE — Progress Notes (Addendum)
New Patient Office Visit  Subjective:  Patient ID: Joanne [name], female    DOB: [birth year]  Age: 38 y.o. MRN: [id number]  CC:  Chief Complaint  Patient presents with  . New Patient (Initial Visit)    HPI Joanne [name] presents for establish care. Major concern is dysmenorrhea and bloating. 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 Last period   Past Medical History:  Diagnosis Date  . Chronic kidney disease    Pt has only one kidney, pt. donated a kidney. Pt. not having any problems  . Hypothyroid   . Postpartum hemorrhage 09/22/2014   EBL 851   . VBAC (vaginal birth after Cesarean) 09/22/2014    Past Surgical History:  Procedure Laterality Date  . CESAREAN SECTION  12/23/2010   Procedure: CESAREAN SECTION;  Surgeon: Kathreen Cosier, MD;  Location: [facility];  Service: Gynecology;  Laterality: N/A;  . donated kidney      Family History  Problem Relation Age of Onset  . Diabetes Maternal Aunt     Social History   Socioeconomic History  . Marital status: Married    Spouse name: Not on file  . Number of children: Not on file  . Years of education: Not on file  . Highest education level: Not on file  Occupational History  . Not on file  Social Needs  . Financial resource strain: Not on file  . Food insecurity:    Worry: Not on file    Inability: Not on file  . Transportation needs:    Medical: Not on file    Non-medical: Not on file  Tobacco Use  . Smoking status: Never Smoker  . Smokeless tobacco: Never Used  Substance and Sexual Activity  . Alcohol use: No  . Drug use: No  . Sexual activity: Yes    Birth control/protection: None  Lifestyle  . Physical activity:    Days per week: Not on file    Minutes per session: Not on file  . Stress: Not on file  Relationships  . Social connections:    Talks on phone: Not on file    Gets together: Not on file    Attends religious service: Not on file    Active  member of club or organization: Not on file    Attends meetings of clubs or organizations: Not on file    Relationship status: Not on file  . Intimate partner violence:    Fear of current or ex partner: Not on file    Emotionally abused: Not on file    Physically abused: Not on file    Forced sexual activity: Not on file  Other Topics Concern  . Not on file  Social History Narrative  . Not on file    ROS Review of Systems  Constitutional: Negative.   HENT: Negative.   Eyes: Negative.   Respiratory: Negative.   Cardiovascular: Negative.   Gastrointestinal: Positive for abdominal pain.  Endocrine: Positive for polyphagia.  Genitourinary: Negative.   Musculoskeletal: Negative.   Skin: Negative.   Allergic/Immunologic: Negative.   Neurological: Negative.   Hematological: Negative.   Psychiatric/Behavioral: Negative.     Objective:   Today's Vitals: BP 108/72 (BP Location: Right Arm, Patient Position: Sitting, Cuff Size: Normal)   Pulse 69   Temp 98.4 F (36.9 C) (Oral)   Ht 5' 0.5" (1.537 m)   Wt 142 lb 9.6 oz (64.7 kg)   LMP 03/14/2018  SpO2 100%   Breastfeeding No   BMI 27.39 kg/m   Physical Exam Vitals signs and nursing note reviewed. Exam conducted with a chaperone present.  Constitutional:      Appearance: Normal appearance.  HENT:     Head: Normocephalic.     Right Ear: Tympanic membrane, ear canal and external ear normal.     Left Ear: Tympanic membrane, ear canal and external ear normal.  Eyes:     Extraocular Movements: Extraocular movements intact.     Pupils: Pupils are equal, round, and reactive to light.  Neck:     Musculoskeletal: Normal range of motion and neck supple.  Cardiovascular:     Rate and Rhythm: Normal rate.     Pulses: Normal pulses.     Heart sounds: Normal heart sounds.  Pulmonary:     Breath sounds: Normal breath sounds.  Abdominal:     General: Abdomen is flat. Bowel sounds are normal.     Palpations: Abdomen is soft.   Musculoskeletal: Normal range of motion.  Skin:    General: Skin is warm.  Neurological:     General: No focal deficit present.     Mental Status: She is alert.  Psychiatric:        Mood and Affect: Mood normal.     Assessment & Plan:   1. Other specified hypothyroidism Not on medication ck TSH/T4   2. Encounter to establish care with new doctor -CBC - Comprehensive metabolic panel  3. Amenorrhea Last menstrual cycle 03/11/2018 - CBC with Differential  - POCT urine pregnancy   Follow-up: Return in about 3 months (around 07/21/2018) for hypothyrodism and dysmenorrhea .   Grayce Sessions, NP

## 2018-04-20 NOTE — Patient Instructions (Signed)
Dismenorrea Dysmenorrhea  La dismenorrea consiste en tener calambres dolorosos durante la menstruacin (perodo menstrual). Sentir dolor en la zona baja del vientre (abdomen). La causa del dolor son los espasmos (contracciones) de los msculos del tero. El dolor puede ser leve o muy intenso. Con esta afeccin, puede presentar lo siguiente:  Dolor de cabeza.  Malestar estomacal (nuseas).  Vmitos.  Sentir dolor en la parte inferior de la espalda. Siga estas indicaciones en su casa: Aliviar el dolor y los calambres   Aplique calor en la parte inferior de la espalda o el vientre cuando tiene dolor o calambres. Use la fuente de calor que el mdico le indique. ? Colquese una toalla entre la piel y la fuente de calor. ? Aplique el calor durante 20 a 30minutos. ? Retire la fuente de calor si la piel se pone de color rojo brillante. Esto es muy importante si no puede sentir el dolor, el calor o el fro. ? No duerma con la almohadilla trmica.  Haga ejercicios aerbicos. Estos pueden incluir caminar, nadar o andar en bicicleta. Pueden ayudar a aliviar los calambres.  Masajee la zona inferior de la espalda o el vientre. Esto puede ayudar a reducir el dolor. Instrucciones generales  Tome los medicamentos de venta libre y los recetados solamente como se lo haya indicado el mdico.  No conduzca ni use maquinaria pesada mientras toma analgsicos recetados.  Evite la ingesta de alcohol y cafena inmediatamente antes y durante el perodo menstrual. Esto puede empeorar los calambres.  No consuma ningn producto que contenga nicotina o tabaco. Esto incluye cigarrillos y cigarrillos electrnicos. Si necesita ayuda para dejar de fumar, consulte al mdico.  Concurra a todas las visitas de control como se lo haya indicado el mdico. Esto es importante. Comunquese con un mdico si:  Siente un dolor que empeora.  El dolor no mejora con medicamentos.  Siente dolor durante las relaciones  sexuales.  Siente malestar estomacal o tiene vmitos durante el perodo menstrual, que no se van con medicamentos. Solicite ayuda de inmediato si:  Pierde el conocimiento (se desmaya). Resumen  La dismenorrea consiste en tener calambres dolorosos durante la menstruacin (perodo menstrual).  Aplique calor en la parte inferior de la espalda o el vientre cuando tiene dolor o calambres.  Haga ejercicios aerbicos como caminar, nadar o andar en bicicleta.  Comunquese con un mdico si siente dolor durante la relacin sexual. Esta informacin no tiene como fin reemplazar el consejo del mdico. Asegrese de hacerle al mdico cualquier pregunta que tenga. Document Released: 03/08/2010 Document Revised: 08/07/2016 Document Reviewed: 08/07/2016 Elsevier Interactive Patient Education  2019 Elsevier Inc.  

## 2018-04-20 NOTE — Addendum Note (Signed)
Addended by: Grayce Sessions on: 04/20/2018 10:01 AM   Modules accepted: Orders

## 2018-04-21 LAB — CBC WITH DIFFERENTIAL/PLATELET
BASOS: 1 %
Basophils Absolute: 0.1 10*3/uL (ref 0.0–0.2)
EOS (ABSOLUTE): 0.1 10*3/uL (ref 0.0–0.4)
Eos: 2 %
Hematocrit: 38.7 % (ref 34.0–46.6)
Hemoglobin: 13.2 g/dL (ref 11.1–15.9)
Immature Grans (Abs): 0 10*3/uL (ref 0.0–0.1)
Immature Granulocytes: 0 %
Lymphocytes Absolute: 1.8 10*3/uL (ref 0.7–3.1)
Lymphs: 27 %
MCH: 28.6 pg (ref 26.6–33.0)
MCHC: 34.1 g/dL (ref 31.5–35.7)
MCV: 84 fL (ref 79–97)
Monocytes Absolute: 0.4 10*3/uL (ref 0.1–0.9)
Monocytes: 7 %
NEUTROS ABS: 4.2 10*3/uL (ref 1.4–7.0)
Neutrophils: 63 %
Platelets: 298 10*3/uL (ref 150–450)
RBC: 4.61 x10E6/uL (ref 3.77–5.28)
RDW: 14.1 % (ref 11.7–15.4)
WBC: 6.6 10*3/uL (ref 3.4–10.8)

## 2018-04-21 LAB — COMPREHENSIVE METABOLIC PANEL
A/G RATIO: 1.7 (ref 1.2–2.2)
ALT: 27 IU/L (ref 0–32)
AST: 18 IU/L (ref 0–40)
Albumin: 4.4 g/dL (ref 3.8–4.8)
Alkaline Phosphatase: 60 IU/L (ref 39–117)
BILIRUBIN TOTAL: 0.3 mg/dL (ref 0.0–1.2)
BUN/Creatinine Ratio: 12 (ref 9–23)
BUN: 10 mg/dL (ref 6–20)
CO2: 20 mmol/L (ref 20–29)
Calcium: 9.4 mg/dL (ref 8.7–10.2)
Chloride: 102 mmol/L (ref 96–106)
Creatinine, Ser: 0.85 mg/dL (ref 0.57–1.00)
GFR calc non Af Amer: 88 mL/min/{1.73_m2} (ref >59)
GFR, EST AFRICAN AMERICAN: 102 mL/min/{1.73_m2} (ref >59)
Globulin, Total: 2.6 g/dL (ref 1.5–4.5)
Glucose: 86 mg/dL (ref 65–99)
POTASSIUM: 4 mmol/L (ref 3.5–5.2)
SODIUM: 135 mmol/L (ref 134–144)
TOTAL PROTEIN: 7 g/dL (ref 6.0–8.5)

## 2018-04-21 LAB — THYROID PANEL WITH TSH
Free Thyroxine Index: 1.4 (ref 1.2–4.9)
T3 UPTAKE RATIO: 20 % — AB (ref 24–39)
T4, Total: 6.9 ug/dL (ref 4.5–12.0)
TSH: 11.34 u[IU]/mL — ABNORMAL HIGH (ref 0.450–4.500)

## 2018-04-22 ENCOUNTER — Other Ambulatory Visit (INDEPENDENT_AMBULATORY_CARE_PROVIDER_SITE_OTHER): Payer: Self-pay | Admitting: Primary Care

## 2018-04-22 MED ORDER — LEVOTHYROXINE SODIUM 75 MCG PO TABS: 75.0000 ug | ORAL_TABLET | Freq: Every day | ORAL | 3 refills | Status: DC

## 2018-04-22 NOTE — Progress Notes (Signed)
Abnormal thyroid results will send in of levoxyl take daily on a empty stomach. Advise GYN you are on this medication

## 2018-04-22 NOTE — Progress Notes (Signed)
Abnormal Thyroid panel start levoxyl Q AM rtn in 6 weeks for reck levels or GYN can ck levels and prescribe medication accordingly

## 2018-04-23 ENCOUNTER — Telehealth (INDEPENDENT_AMBULATORY_CARE_PROVIDER_SITE_OTHER): Payer: Self-pay

## 2018-04-23 NOTE — Telephone Encounter (Signed)
Call placed using pacific interpreter [phone]). Patient is aware that thyroid result is abnormal. Levothyroxine 75 mcg has been sent to pharmacy. Patient is aware to take medication daily on an empty stomach. She was advised to let her GYN know that she is on levothyroxine. Patient expressed understanding. Maryjean Morn, CMA

## 2018-04-23 NOTE — Telephone Encounter (Signed)
-----   Message from Grayce Sessions, NP sent at 04/22/2018 10:30 AM EST ----- Abnormal thyroid results will send in of levoxyl take daily on a empty stomach. Advise GYN you are on this medication

## 2018-05-11 ENCOUNTER — Inpatient Hospital Stay (HOSPITAL_COMMUNITY): Payer: Self-pay

## 2018-05-11 ENCOUNTER — Other Ambulatory Visit: Payer: Self-pay

## 2018-05-11 ENCOUNTER — Telehealth: Payer: Self-pay | Admitting: General Practice

## 2018-05-11 ENCOUNTER — Inpatient Hospital Stay (HOSPITAL_COMMUNITY)
Admission: AD | Admit: 2018-05-11 | Discharge: 2018-05-11 | Disposition: A | Discharge status: Home / Self Care | Payer: Self-pay | Attending: Obstetrics and Gynecology | Admitting: Obstetrics and Gynecology

## 2018-05-11 ENCOUNTER — Encounter (HOSPITAL_COMMUNITY): Payer: Self-pay | Admitting: *Deleted

## 2018-05-11 DIAGNOSIS — O4691 Antepartum hemorrhage, unspecified, first trimester: Secondary | ICD-10-CM

## 2018-05-11 DIAGNOSIS — O26891 Other specified pregnancy related conditions, first trimester: Secondary | ICD-10-CM | POA: Insufficient documentation

## 2018-05-11 DIAGNOSIS — E039 Hypothyroidism, unspecified: Secondary | ICD-10-CM | POA: Insufficient documentation

## 2018-05-11 DIAGNOSIS — O209 Hemorrhage in early pregnancy, unspecified: Secondary | ICD-10-CM | POA: Insufficient documentation

## 2018-05-11 DIAGNOSIS — Z3A08 8 weeks gestation of pregnancy: Secondary | ICD-10-CM | POA: Insufficient documentation

## 2018-05-11 DIAGNOSIS — Z3491 Encounter for supervision of normal pregnancy, unspecified, first trimester: Secondary | ICD-10-CM

## 2018-05-11 DIAGNOSIS — O219 Vomiting of pregnancy, unspecified: Secondary | ICD-10-CM

## 2018-05-11 DIAGNOSIS — Z79890 Hormone replacement therapy (postmenopausal): Secondary | ICD-10-CM | POA: Insufficient documentation

## 2018-05-11 DIAGNOSIS — O210 Mild hyperemesis gravidarum: Secondary | ICD-10-CM | POA: Insufficient documentation

## 2018-05-11 DIAGNOSIS — Z886 Allergy status to analgesic agent status: Secondary | ICD-10-CM | POA: Insufficient documentation

## 2018-05-11 DIAGNOSIS — N189 Chronic kidney disease, unspecified: Secondary | ICD-10-CM | POA: Insufficient documentation

## 2018-05-11 DIAGNOSIS — O4690 Antepartum hemorrhage, unspecified, unspecified trimester: Secondary | ICD-10-CM

## 2018-05-11 HISTORY — DX: Hypothyroidism, unspecified: E03.9

## 2018-05-11 LAB — CBC
HEMATOCRIT: 38.2 % (ref 36.0–46.0)
HEMOGLOBIN: 13.3 g/dL (ref 12.0–15.0)
MCH: 29.4 pg (ref 26.0–34.0)
MCHC: 34.8 g/dL (ref 30.0–36.0)
MCV: 84.5 fL (ref 80.0–100.0)
Platelets: 295 10*3/uL (ref 150–400)
RBC: 4.52 MIL/uL (ref 3.87–5.11)
RDW: 13.7 % (ref 11.5–15.5)
WBC: 8.1 10*3/uL (ref 4.0–10.5)
nRBC: 0 % (ref 0.0–0.2)

## 2018-05-11 LAB — URINALYSIS, ROUTINE W REFLEX MICROSCOPIC
BILIRUBIN URINE: NEGATIVE
Bacteria, UA: NONE SEEN
Glucose, UA: NEGATIVE mg/dL
Hgb urine dipstick: NEGATIVE
Ketones, ur: 80 mg/dL — AB
Nitrite: NEGATIVE
Protein, ur: NEGATIVE mg/dL
Specific Gravity, Urine: 1.028 (ref 1.005–1.030)
pH: 5 (ref 5.0–8.0)

## 2018-05-11 LAB — WET PREP, GENITAL
Clue Cells Wet Prep HPF POC: NONE SEEN
SPERM: NONE SEEN
Trich, Wet Prep: NONE SEEN
Yeast Wet Prep HPF POC: NONE SEEN

## 2018-05-11 LAB — HCG, QUANTITATIVE, PREGNANCY: HCG, BETA CHAIN, QUANT, S: 102676 m[IU]/mL — AB (ref <5)

## 2018-05-11 MED ORDER — METOCLOPRAMIDE HCL 10 MG PO TABS: 10.0000 mg | ORAL_TABLET | Freq: Three times a day (TID) | ORAL | 0 refills | Status: DC | PRN

## 2018-05-11 NOTE — Telephone Encounter (Signed)
Patient called stating that she is having brown discharge, abdominal pain and chills.  Patient is pregnant.  Advised pt to go to [facility] for evaluation.

## 2018-05-11 NOTE — MAU Provider Note (Addendum)
History     CSN: [id number]  Arrival date and time: 05/11/18 1543   First Provider Initiated Contact with Patient 05/11/18 1634      Chief Complaint  Patient presents with  . Vaginal Discharge  . Abdominal Pain  . Chills   G3P2002  by LMP presenting with brown vaginal discharge and LAP. Discharge has been present x3 days. It's only on the toilet paper when she wipes. No recent IC. Reports intermittent LAP since last menses. Pain is worse on left. Rates 6/10. Has not taken any OTC for it. Resting helps the pain. Reports 1 week hx of dysuria. No other urinary sx. Endorses 1 week of chills. No fevers. No sick contacts. No known exposure to COVID-19. No recent travel out of state.    OB History    Gravida  3   Para  2   Term  2   Preterm      AB      Living  2     SAB      TAB      Ectopic      Multiple  0   Live Births  2           Past Medical History:  Diagnosis Date  . Chronic kidney disease    Pt has only one kidney, pt. donated a kidney. Pt. not having any problems  . Hypothyroid   . Postpartum hemorrhage 09/22/2014   EBL 851   . VBAC (vaginal birth after Cesarean) 09/22/2014    Past Surgical History:  Procedure Laterality Date  . CESAREAN SECTION  12/23/2010   Procedure: CESAREAN SECTION;  Surgeon: Kathreen Cosier, MD;  Location: [facility];  Service: Gynecology;  Laterality: N/A;  . donated kidney      Family History  Problem Relation Age of Onset  . Diabetes Maternal Aunt     Social History   Tobacco Use  . Smoking status: Never Smoker  . Smokeless tobacco: Never Used  Substance Use Topics  . Alcohol use: No  . Drug use: No    Allergies:  Allergies  Allergen Reactions  . Nsaids Other (See Comments)    Patient with one kidney, avoid NSAIDS    Medications Prior to Admission  Medication Sig Dispense Refill Last Dose  . levothyroxine (SYNTHROID, LEVOTHROID) 75 MCG tablet Take 1 tablet (75 mcg total) by mouth daily. 90 tablet 3      Review of Systems  Constitutional: Positive for chills. Negative for fever.  Gastrointestinal: Positive for abdominal pain.  Genitourinary: Positive for dysuria and vaginal discharge. Negative for frequency, urgency and vaginal bleeding.   Physical Exam   Blood pressure 118/68, pulse (!) 59, temperature 99.1 F (37.3 C), temperature source Oral, resp. rate 16, height  (1.549 m), weight 63.6 kg, last menstrual period 03/14/2018, SpO2 100 %.  Physical Exam  Nursing note and vitals reviewed. Constitutional: She is oriented to person, place, and time. She appears well-developed and well-nourished. No distress.  HENT:  Head: Normocephalic and atraumatic.  Neck: Normal range of motion.  Cardiovascular: Normal rate.  Respiratory: Effort normal. No respiratory distress.  GI: Soft. She exhibits no distension and no mass. There is no abdominal tenderness. There is no rebound and no guarding.  Genitourinary:    Genitourinary Comments: External: no lesions or erythema Vagina: rugated, pink, moist, scant yellow/brown discharge Uterus: + enlarged, anteverted, non tender, no CMT Adnexae: no masses, no tenderness left, no tenderness right Cervix closed  Musculoskeletal: Normal range of motion.  Neurological: She is alert and oriented to person, place, and time.  Skin: Skin is warm and dry.  Psychiatric: She has a normal mood and affect.   Results for orders placed or performed during the hospital encounter of 05/11/18 (from the past 24 hour(s))  Urinalysis, Routine w reflex microscopic     Status: Abnormal   Collection Time: 05/11/18  4:03 PM  Result Value Ref Range   Color, Urine YELLOW YELLOW   APPearance HAZY (A) CLEAR   Specific Gravity, Urine 1.028 1.005 - 1.030   pH 5.0 5.0 - 8.0   Glucose, UA NEGATIVE NEGATIVE mg/dL   Hgb urine dipstick NEGATIVE NEGATIVE   Bilirubin Urine NEGATIVE NEGATIVE   Ketones, ur 80 (A) NEGATIVE mg/dL   Protein, ur NEGATIVE NEGATIVE mg/dL    Nitrite NEGATIVE NEGATIVE   Leukocytes,Ua SMALL (A) NEGATIVE   RBC / HPF 0-5 0 - 5 RBC/hpf   WBC, UA 0-5 0 - 5 WBC/hpf   Bacteria, UA NONE SEEN NONE SEEN   Squamous Epithelial / LPF 0-5 0 - 5   Mucus PRESENT   Wet prep, genital     Status: Abnormal   Collection Time: 05/11/18  4:33 PM  Result Value Ref Range   Yeast Wet Prep HPF POC NONE SEEN NONE SEEN   Trich, Wet Prep NONE SEEN NONE SEEN   Clue Cells Wet Prep HPF POC NONE SEEN NONE SEEN   WBC, Wet Prep HPF POC MANY (A) NONE SEEN   Sperm NONE SEEN   CBC     Status: None   Collection Time: 05/11/18  5:36 PM  Result Value Ref Range   WBC 8.1 4.0 - 10.5 K/uL   RBC 4.52 3.87 - 5.11 MIL/uL   Hemoglobin 13.3 12.0 - 15.0 g/dL   HCT 25.6 38.9 - 37.3 %   MCV 84.5 80.0 - 100.0 fL   MCH 29.4 26.0 - 34.0 pg   MCHC 34.8 30.0 - 36.0 g/dL   RDW 42.8 76.8 - 11.5 %   Platelets 295 150 - 400 K/uL   nRBC 0.0 0.0 - 0.2 %   US Ob Less Than 14 Weeks With Ob Transvaginal  Result Date: 05/11/2018 CLINICAL DATA:  Left lower quadrant pain for 2 weeks. Vaginal bleeding. Gestational age by LMP of 8 weeks 2 days. EXAM: OBSTETRIC <14 WK [place] AND TRANSVAGINAL OB US TECHNIQUE: Both transabdominal and transvaginal ultrasound examinations were performed for complete evaluation of the gestation as well as the maternal uterus, adnexal regions, and pelvic cul-de-sac. Transvaginal technique was performed to assess early pregnancy. COMPARISON:  None. FINDINGS: Intrauterine gestational sac: Single Yolk sac:  Visualized. Embryo:  Visualized. Cardiac Activity: Visualized. Heart Rate: 153 bpm CRL:  14 mm   7 w   5 d                  [place] EDC: 12/23/2018 Subchorionic hemorrhage:  None Maternal uterus/adnexae: Both ovaries are normal in appearance. No mass or abnormal free fluid identified. IMPRESSION: Single living IUP measuring 7 weeks 5 days, with [place] EDC of 12/23/2018. No significant maternal uterine or adnexal abnormality identified. Electronically Signed   By: Myles Rosenthal M.D.    On: 05/11/2018 18:48    MAU Course  Procedures Orders Placed This Encounter  Procedures  . Wet prep, genital    Standing Status:   Standing    Number of Occurrences:   1    Order Specific Question:   Patient  immune status    Answer:   Normal  . US OB LESS THAN 14 WEEKS WITH OB TRANSVAGINAL    Standing Status:   Standing    Number of Occurrences:   1    Order Specific Question:   Symptom/Reason for Exam    Answer:   Vaginal bleeding in pregnancy [[id number]]  . Urinalysis, Routine w reflex microscopic    Standing Status:   Standing    Number of Occurrences:   1  . CBC    Standing Status:   Standing    Number of Occurrences:   1  . hCG, quantitative, pregnancy    Standing Status:   Standing    Number of Occurrences:   1   MDM Labs and [place] ordered.  Transfer of care given to [facility], FNP Donette Larry, CNM  05/11/2018 5:40 PM   RH positive Ultrasound shows live IUP   Assessment and Plan   1. Normal IUP (intrauterine pregnancy) on prenatal ultrasound, first trimester  -Discharge home in stable condition -Discussed bleeding/return precautions -GC/CT pending -Start prenatal care as scheduled   2. Vaginal bleeding in pregnancy   3. Nausea and vomiting during pregnancy prior to [redacted] weeks gestation  -Rx reglan -Given information for morning sickness & OTC nausea tx    Judeth Horn, NP    Due to language barrier, an interpreter was present during the history-taking and subsequent discussion (and for the physical exam) with this patient.

## 2018-05-11 NOTE — Discharge Instructions (Signed)
Las medicinas seguras para tomar Academic librarian  Safe Medications in Pregnancy  Acn:  Benzoyl Peroxide (Perxido de benzolo)  Salicylic Acid (cido saliclico)  Dolor de espalda/Dolor de cabeza:  Tylenol: 2 pastillas de concentracin regular cada 4 horas O 2 pastillas de concentracin fuerte cada 6 horas  Resfriados/Tos/Alergias:  Benadryl (sin alcohol) 25 mg cada 6 horas segn lo necesite Breath Right strips (Tiras para respirar correctamente)  Claritin  Cepacol (pastillas de chupar para la garganta)  Chloraseptic (aerosol para la garganta)  Cold-Eeze- hasta tres veces por da  Cough drops (pastillas de chupar para la tos, sin alcohol)  Flonase (con receta mdica solamente)  Guaifenesin  Mucinex  Robitussin DM (simple solamente, sin alcohol)  Saline nasal spray/drops (Aerosol nasal salino/gotas) Sudafed (pseudoephedrine) y  Actifed * utilizar slo despus de 12 semanas de gestacin y si no tiene la presin arterial alta.  Tylenol Vicks  VapoRub  Zinc lozenges (pastillas para la garganta)  Zyrtec  Estreimiento:  Colace  Ducolax (supositorios)  Fleet enema (lavado intestinal rectal)  Glycerin (supositorios)  Metamucil  Milk of magnesia (leche de magnesia)  Miralax  Senokot  Smooth Move (t)  Diarrea:  Kaopectate Imodium A-D  *NO tome Pepto-Bismol  Hemorroides:  Anusol  Anusol HC  Preparation H  Tucks  Indigestin:  Tums  Maalox  Mylanta  Zantac  Pepcid  Insomnia:  Benadryl (sin alcohol) 25mg  cada 6 horas segn lo necesite  Tylenol PM  Unisom, no Gelcaps  Calambres en las piernas:  Tums  MagGel Nuseas/Vmitos:  Bonine  Dramamine  Emetrol  Ginger (extracto)  Sea-Bands  Meclizine  Medicina para las nuseas que puede tomar durante el embarazo: Unisom (doxylamine succinate, pastillas de 25 mg) Tome una pastilla al da al [place]. Si los sntomas no estn adecuadamente controlados, la dosis puede aumentarse hasta una dosis mxima recomendada de [employer] al da (1/2 pastilla por la [place], 1/2 pastilla a media tarde y Neomia Dear pastilla al [place]). Pastillas de Vitamina B6 de 100mg . Tome [employer] veces al da (hasta 200 mg por da).  Erupciones en la piel:  Productos de Aveeno  Benadryl cream (crema o una dosis de 25mg  cada 6 horas segn lo necesite)  Calamine Lotion (locin)  1% cortisone cream (crema de cortisona de 1%)  nfeccin vaginal por hongos (candidiasis):  Gyne-lotrimin 7  Monistat 7   **Si est tomando varias medicinas, por favor revise las etiquetas para Art gallery manager los mismos ingredientes [place]. **Tome la medicina segn lo indicado en la etiqueta. **No tome ms de 400 mg de Tylenol en 24 horas. **No tome medicinas que contengan aspirina o ibuprofeno.        Nuseas matinales Morning Sickness  Se denomina nuseas matinales a las ganas de vomitar de las mujeres durante el [place]. Esta sensacin puede estar acompaada o no de vmitos. Suelen aparecer por la maana, pero pueden ser un problema a lo largo de [employer]. Las nuseas matinales son ms frecuentes Social research officer, government. En algunos casos, podran continuar durante todo el [place]. Aunque son [place], generalmente, no causan ningn dao, excepto que una mujer presente vmitos continuos e intensos (hipermesis gravdica), una afeccin que requiere un tratamiento ms intenso. Cules son las causas? La causa exacta de esta afeccin no se conoce, pero estas parecen estar relacionadas con los cambios hormonales normales que ocurren durante el [place]. Qu incrementa el riesgo? Es ms probable que usted sufra esta afeccin si:  Tena nuseas o vmitos antes de quedar  embarazada.  Tuvo nuseas matinales en algn embarazo anterior.  Est embarazada de ms de un beb, por ejemplo, mellizos. Cules son los signos o los sntomas? Los sntomas de esta afeccin [employer] siguientes:  Nuseas.  Vmitos. Cmo se diagnostica? Esta  afeccin suele diagnosticarse en funcin de los signos y los sntomas. Cmo se trata? En muchos casos, no se requiere tratamiento para esta afeccin. Hacer algunos cambios en su dieta podra ayudar a controlar los sntomas. El mdico tambin podra recetarle o recomendarle lo siguiente:  Suplementos de vitamina B6.  Medicamentos para las nauseas.  Jengibre. Siga estas indicaciones en su casa: Medicamentos  [employer] de venta libre y los recetados solamente como se lo haya indicado el mdico. No utilice ningn medicamento recetado, de venta libre ni herbario para tratar este problema sin consultar con su mdico antes.  Tomar un multivitamnico antes de Scientist, research (physical sciences) puede prevenir o disminuir la gravedad de las nuseas matinales en la mayora de las [place]. Comida y bebida  Coma un trozo de [place] o galletas secas antes de levantarse de la cama por la [place].  Coma 5 o 6 comidas pequeas por da.  Consuma alimentos blandos y secos, como arroz o papas asadas. Los alimentos ricos en carbohidratos generalmente ayudan.  Evite los alimentos muy grasos y condimentados.  Pdale a otra persona que cocine por usted si el olor de algn alimento le provoca nuseas o vmitos.  Si tiene ganas de vomitar despus de tomar las vitaminas prenatales, tmelas a la noche o con una colacin.  Tome colaciones de alimentos proteicos entre comidas si siente apetito. Los frutos secos, el yogur y el queso son buenas opciones.  Beba lquidos durante todo [employer].  Pruebe a tomar gaseosa de jengibre hecha con jengibre natural, t de jengibre hecho con jengibre fresco rallado o caramelos de jengibre. Instrucciones generales  No consuma ningn producto que contenga nicotina o tabaco, como cigarrillos y Administrator, Civil Service. Si necesita ayuda para dejar de fumar, consulte al [employer].  Consiga un purificador de aire para Radio producer aire de su casa libre de [place].  Trate de respirar Curator.  Intente evitar los olores que le provocan nuseas.  Considere la posibilidad de Electrical engineer los siguientes mtodos para Paramedic los sntomas: ? Usar una pulsera de acupresin. Estas pulseras suelen usarse contra los [employer]. ? Acupuntura. Comunquese con un mdico si:  Los remedios caseros no funcionan, y Media planner.  Se siente mareada o que va a desvanecerse.  Pierde peso. Solicite ayuda de inmediato si:  Tiene nuseas y vmitos de [place] persistente y no puede controlarlos.  Se desmaya.  Siente un dolor intenso en el abdomen. Resumen  Se denomina nuseas matinales a las ganas de vomitar de las mujeres durante el [address]. Esta sensacin puede estar acompaada o no de vmitos.  Las nuseas matinales son ms frecuentes Social research officer, government.  Suelen aparecer por la maana, pero pueden ser un problema a lo largo de [employer].  En muchos casos, no se requiere tratamiento para esta afeccin. Hacer algunos cambios en su dieta podra ayudar a controlar los sntomas. Esta informacin no tiene Theme park manager el consejo del mdico. Asegrese de hacerle al mdico cualquier pregunta que tenga. Document Released: 05/22/2008 Document Revised: 11/03/2016 Document Reviewed: 11/03/2016 Elsevier Interactive Patient Education  2019 [employer].

## 2018-05-11 NOTE — MAU Note (Signed)
The past 3 days, when she wipes she has seen a brown d/c. Been having LLQ pain for 2 wks, cramping/ like contractions. Some pelvic pressure, more uncomfortable when she sits. Wakes up in the morning with a HA, pain on rt side of head, not currently.  Having chills- last all day, been going on for about a wk. Has not checked her temp. Denies cough, sore throat or SOB.Today she feels very tired and having back pain.

## 2018-05-12 LAB — GC/CHLAMYDIA PROBE AMP (~~LOC~~) NOT AT ARMC
CHLAMYDIA, DNA PROBE: NEGATIVE
NEISSERIA GONORRHEA: NEGATIVE

## 2018-06-01 ENCOUNTER — Ambulatory Visit (INDEPENDENT_AMBULATORY_CARE_PROVIDER_SITE_OTHER): Payer: Self-pay | Admitting: *Deleted

## 2018-06-01 ENCOUNTER — Other Ambulatory Visit: Payer: Self-pay

## 2018-06-01 VITALS — Ht 61.0 in

## 2018-06-01 DIAGNOSIS — Z3480 Encounter for supervision of other normal pregnancy, unspecified trimester: Secondary | ICD-10-CM | POA: Insufficient documentation

## 2018-06-01 DIAGNOSIS — O219 Vomiting of pregnancy, unspecified: Secondary | ICD-10-CM

## 2018-06-01 MED ORDER — PROMETHAZINE HCL 25 MG PO TABS: 25.0000 mg | ORAL_TABLET | Freq: Four times a day (QID) | ORAL | 1 refills | Status: DC | PRN

## 2018-06-01 NOTE — Progress Notes (Signed)
    Virtual Visit via Telephone Note  I connected with Joanne [name] on 06/01/18 at  9:30 AM EDT by telephone and verified that I am speaking with the correct person using two identifiers.   I discussed the limitations, risks, security and privacy concerns of performing an evaluation and management service by telephone and the availability of in person appointments. I also discussed with the patient that there may be a patient responsible charge related to this service. The patient expressed understanding and agreed to proceed.  Pacific InterpretersLars Mage ID: [id number]   History of Present Illness:  PRENATAL INTAKE SUMMARY  Joanne [name] presents today New OB Nurse Interview.  OB History    Gravida  3   Para  2   Term  2   Preterm      AB      Living  2     SAB      TAB      Ectopic      Multiple  0   Live Births  2          I have reviewed the patient's medical, obstetrical, social, and family histories, medications, and available lab results.  SUBJECTIVE She has no unusual complaints and complains of nausea without vomiting for 7 days. Patient also reported yellow vaginal discharge, denies itchy, burning or odor.  Observations/Objective: Initial nurse interview for history (New OB). EDD: 12/19/2018 by LMP GA: [redacted]w[redacted]d G3P2002  Patient was not able to provide vital signs at this time.  GENERAL APPEARANCE: Non-face to face interview.  Assessment and Plan: Normal pregnancy All lab work and physical will be completed at next visit with Midwife. Phenergan 25 mg sent to pharmacy for nausea. Reglan was not giving patient any relief. Advised patient to try over the counter Vitamin B-6.  Patient to purchase blood pressure cuff.  Follow Up Instructions: New OB appt 06/10/2018 at 2:10PM   I discussed the assessment and treatment plan with the patient. The patient was provided an opportunity to ask questions and all were answered. The patient agreed with the  plan and demonstrated an understanding of the instructions.   The patient was advised to call back or seek an in-person evaluation if the symptoms worsen or if the condition fails to improve as anticipated.  I provided 20 minutes of non-face-to-face time during this encounter.   Clovis Pu, RN

## 2018-06-10 ENCOUNTER — Ambulatory Visit (INDEPENDENT_AMBULATORY_CARE_PROVIDER_SITE_OTHER): Payer: Self-pay | Admitting: Obstetrics and Gynecology

## 2018-06-10 ENCOUNTER — Other Ambulatory Visit: Payer: Self-pay

## 2018-06-10 ENCOUNTER — Encounter: Payer: Self-pay | Admitting: Obstetrics and Gynecology

## 2018-06-10 VITALS — BP 102/61 | HR 67 | Temp 98.2°F | Wt 139.4 lb

## 2018-06-10 DIAGNOSIS — O26831 Pregnancy related renal disease, first trimester: Secondary | ICD-10-CM

## 2018-06-10 DIAGNOSIS — O09511 Supervision of elderly primigravida, first trimester: Secondary | ICD-10-CM

## 2018-06-10 DIAGNOSIS — O26891 Other specified pregnancy related conditions, first trimester: Secondary | ICD-10-CM

## 2018-06-10 DIAGNOSIS — O09519 Supervision of elderly primigravida, unspecified trimester: Secondary | ICD-10-CM

## 2018-06-10 DIAGNOSIS — E039 Hypothyroidism, unspecified: Secondary | ICD-10-CM

## 2018-06-10 DIAGNOSIS — Z524 Kidney donor: Secondary | ICD-10-CM | POA: Insufficient documentation

## 2018-06-10 DIAGNOSIS — O26839 Pregnancy related renal disease, unspecified trimester: Secondary | ICD-10-CM

## 2018-06-10 DIAGNOSIS — Z3480 Encounter for supervision of other normal pregnancy, unspecified trimester: Secondary | ICD-10-CM

## 2018-06-10 DIAGNOSIS — Z3A12 12 weeks gestation of pregnancy: Secondary | ICD-10-CM

## 2018-06-10 DIAGNOSIS — N898 Other specified noninflammatory disorders of vagina: Secondary | ICD-10-CM

## 2018-06-10 DIAGNOSIS — G4700 Insomnia, unspecified: Secondary | ICD-10-CM

## 2018-06-10 DIAGNOSIS — Z113 Encounter for screening for infections with a predominantly sexual mode of transmission: Secondary | ICD-10-CM

## 2018-06-10 DIAGNOSIS — O26899 Other specified pregnancy related conditions, unspecified trimester: Secondary | ICD-10-CM

## 2018-06-10 DIAGNOSIS — O99281 Endocrine, nutritional and metabolic diseases complicating pregnancy, first trimester: Secondary | ICD-10-CM

## 2018-06-10 MED ORDER — MAGNESIUM 200 MG PO TABS: 400.0000 mg | ORAL_TABLET | Freq: Every day | ORAL | 2 refills | Status: DC

## 2018-06-10 NOTE — Progress Notes (Signed)
Subjective:    Joanne [name] is being seen today for her first obstetrical visit.  This is a planned pregnancy. She is at 4759w4d gestation. Her obstetrical history is significant for advanced maternal age and h/o previous low-transverse cesarean delivery d/t FTP (Dr. Gaynell [name] 12/23/2010), h/o successful vacuum-assisted VBAC (09/22/2014), hypothyroidism, renal disease (has one kidney - one surgically removed), AMA (over 36 at the time of delivery). Relationship with FOB Joanne Pesa(Ysauro): spouse, living together. Patient does intend to breast feed. Pregnancy history fully reviewed.  Patient reports nausea, vaginal irritation, vomiting, vaginal odor, and insomnia. She reports the medications she was Rx'd for N/V only helps minimally, but she tolerates.  Review of Systems:   Review of Systems  Constitutional: Negative.   HENT: Negative.   Eyes: Negative.   Respiratory: Negative.   Cardiovascular: Negative.   Gastrointestinal: Positive for nausea and vomiting.  Endocrine: Negative.   Genitourinary: Positive for vaginal discharge (odor).  Musculoskeletal: Negative.   Skin: Negative.   Allergic/Immunologic: Negative.   Neurological: Negative.   Hematological: Negative.   Psychiatric/Behavioral: Negative.     Objective:     BP 102/61   Pulse 67   Temp 98.2 F (36.8 C)   Wt 139 lb 6.4 oz (63.2 kg)   LMP 03/14/2018 (Approximate)   BMI 26.34 kg/m  Physical Exam  Nursing note and vitals reviewed. Constitutional: She is oriented to person, place, and time. She appears well-developed and well-nourished.  HENT:  Head: Normocephalic and atraumatic.  Right Ear: External ear normal.  Left Ear: External ear normal.  Nose: Nose normal.  Mouth/Throat: Oropharynx is clear and moist.  Eyes: Pupils are equal, round, and reactive to light. Conjunctivae and EOM are normal.  Neck: Normal range of motion. Neck supple.  Cardiovascular: Normal rate, regular rhythm, normal heart sounds and intact distal  pulses.  Respiratory: Effort normal and breath sounds normal.  GI: Soft. Bowel sounds are normal.  Genitourinary:    Vulva normal.     Vaginal discharge present.     Genitourinary Comments: Uterus: enlarged, S>D, SE: cervix is smooth, pink, no lesions, small amt of thick, white vaginal d/c -- WP, GC/CT done, closed/long/firm, no CMT or friability, no adnexal tenderness    Musculoskeletal: Normal range of motion.  Neurological: She is alert and oriented to person, place, and time. She has normal reflexes.  Skin: Skin is warm and dry.  Psychiatric: She has a normal mood and affect. Her behavior is normal. Judgment and thought content normal.    Maternal Exam:  Abdomen: Patient reports no abdominal tenderness. Surgical scars: low transverse and low vertical midline.   Fundal height is 14-15 cm.    Introitus: Normal vulva. Vagina is positive for vaginal discharge.  Ferning test: not done.  Nitrazine test: not done. Amniotic fluid character: not assessed.  Pelvis: adequate for delivery.   Cervix: Cervix evaluated by sterile speculum exam and digital exam.     Fetal Exam Fetal Monitor Review: Mode: hand-held doppler probe.   Baseline rate: 161 bpm.     **Low vertical scar from kidney removal and low transverse scar from cesarean delivery per pt. Op note from cesarean delivery in Epic.Joanne [name]**  ^^Pap not done today. Will have pap through [facility] -- Joanne Muhristine Brannock, RN notified and given patient's MRN to get scheduled once COVID-19 restrictions are lifted from that clinic.  Assessment:    Pregnancy: [id number] Patient Active Problem List   Diagnosis Date Noted  . Renal disease during pregnancy 06/10/2018  . Supervision of other  normal pregnancy, antepartum 06/01/2018  . Hypothyroidism complicating pregnancy 12/23/2010       Plan:     Initial prenatal, [facility] labs drawn. Prenatal vitamins. Problem list reviewed and updated. [facility]othy[facility] by [facility]  Medicine -- next appt with Joanne Passe, FNP is 07/21/2018. Rx for Magnesium supplement 400 mg hs for insomnia AFP3 discussed: undecided. Role of ultrasound in pregnancy discussed; fetal survey: ordered -- to be done at [facility]. The nature of [place] [facility] with multiple MDs and other Advanced Practice Providers was explained to patient; also emphasized that residents, students are part of our team.  Amniocentesis discussed: not indicated. Follow up in 8 weeks. Patient enrolled in Babyscripts, BP cuff given and Webex application downloaded on phone. Explanation of extended OB visits schedule and Webex. 50% of 40 min visit spent on counseling and coordination of care.      Joanne Mora MSN, CNM 06/10/2018

## 2018-06-10 NOTE — Patient Instructions (Signed)
Nuseas matinales Morning Sickness  Se denomina nuseas matinales a las ganas de vomitar de las mujeres durante el embarazo. Esta sensacin puede estar acompaada o no de vmitos. Suelen aparecer por la maana, pero pueden ser un problema a lo largo de todo el da. Las nuseas matinales son ms frecuentes durante el primer trimestre. En algunos casos, podran continuar durante todo el embarazo. Aunque son molestas, generalmente, no causan ningn dao, excepto que una mujer presente vmitos continuos e intensos (hipermesis gravdica), una afeccin que requiere un tratamiento ms intenso. Cules son las causas? La causa exacta de esta afeccin no se conoce, pero estas parecen estar relacionadas con los cambios hormonales normales que ocurren durante el embarazo. Qu incrementa el riesgo? Es ms probable que usted sufra esta afeccin si:  Tena nuseas o vmitos antes de quedar embarazada.  Tuvo nuseas matinales en algn embarazo anterior.  Est embarazada de ms de un beb, por ejemplo, mellizos. Cules son los signos o los sntomas? Los sntomas de esta afeccin incluyen los siguientes:  Nuseas.  Vmitos. Cmo se diagnostica? Esta afeccin suele diagnosticarse en funcin de los signos y los sntomas. Cmo se trata? En muchos casos, no se requiere tratamiento para esta afeccin. Hacer algunos cambios en su dieta podra ayudar a controlar los sntomas. El mdico tambin podra recetarle o recomendarle lo siguiente:  Suplementos de vitamina B6.  Medicamentos para las nauseas.  Jengibre. Siga estas indicaciones en su casa: Medicamentos  Tome los medicamentos de venta libre y los recetados solamente como se lo haya indicado el mdico. No utilice ningn medicamento recetado, de venta libre ni herbario para tratar este problema sin consultar con su mdico antes.  Tomar un multivitamnico antes de quedar embarazada puede prevenir o disminuir la gravedad de las nuseas matinales en  la mayora de las mujeres. Comida y bebida  Coma un trozo de tostada o galletas secas antes de levantarse de la cama por la maana.  Coma 5 o 6 comidas pequeas por da.  Consuma alimentos blandos y secos, como arroz o papas asadas. Los alimentos ricos en carbohidratos generalmente ayudan.  Evite los alimentos muy grasos y condimentados.  Pdale a otra persona que cocine por usted si el olor de algn alimento le provoca nuseas o vmitos.  Si tiene ganas de vomitar despus de tomar las vitaminas prenatales, tmelas a la noche o con una colacin.  Tome colaciones de alimentos proteicos entre comidas si siente apetito. Los frutos secos, el yogur y el queso son buenas opciones.  Beba lquidos durante todo el da.  Pruebe a tomar gaseosa de jengibre hecha con jengibre natural, t de jengibre hecho con jengibre fresco rallado o caramelos de jengibre. Instrucciones generales  No consuma ningn producto que contenga nicotina o tabaco, como cigarrillos y cigarrillos electrnicos. Si necesita ayuda para dejar de fumar, consulte al mdico.  Consiga un purificador de aire para mantener el aire de su casa libre de olores.  Trate de respirar aire fresco.  Intente evitar los olores que le provocan nuseas.  Considere la posibilidad de intentar los siguientes mtodos para aliviar los sntomas: ? Usar una pulsera de acupresin. Estas pulseras suelen usarse contra los mareos. ? Acupuntura. Comunquese con un mdico si:  Los remedios caseros no funcionan, y necesita medicamentos.  Se siente mareada o que va a desvanecerse.  Pierde peso. Solicite ayuda de inmediato si:  Tiene nuseas y vmitos de manera persistente y no puede controlarlos.  Se desmaya.  Siente un dolor intenso en el abdomen. Resumen    Se denomina nuseas matinales a las ganas de vomitar de las mujeres durante el [address] Esta sensacin puede estar acompaada o no de vmitos.  Las nuseas matinales son ms frecuentes  Social research officer, government.  Suelen aparecer por la maana, pero pueden ser un problema a lo largo de [employer].  En muchos casos, no se requiere tratamiento para esta afeccin. Hacer algunos cambios en su dieta podra ayudar a controlar los sntomas. Esta informacin no tiene Theme park manager el consejo del mdico. Asegrese de hacerle al mdico cualquier pregunta que tenga. Document Released: 05/22/2008 Document Revised: 11/03/2016 Document Reviewed: 11/03/2016 Elsevier Interactive Patient Education  2019 [employer]. Eating Plan for Pregnant Women While you are pregnant, your body requires additional nutrition to help support your growing baby. You also have a higher need for some vitamins and minerals, such as folic acid, calcium, iron, and vitamin D. Eating a healthy, well-balanced diet is very important for your health and your baby's health. Your need for extra calories varies for the three 32-month segments of your pregnancy (trimesters). For most women, it is recommended to consume:  150 extra calories a day during the first trimester.  300 extra calories a day during the second trimester.  300 extra calories a day during the third trimester. What are tips for following this plan?   Do not try to lose weight or go on a diet during pregnancy.  Limit your overall intake of foods that have "empty calories." These are foods that have little nutritional value, such as sweets, desserts, candies, and sugar-sweetened beverages.  Eat a variety of foods (especially fruits and vegetables) to get a full range of vitamins and minerals.  Take a prenatal vitamin to help meet your additional vitamin and mineral needs during pregnancy, specifically for folic acid, iron, calcium, and vitamin D.  Remember to stay active. Ask your health care provider what types of exercise and activities are safe for you.  Practice good food safety and cleanliness. Wash your hands before you eat and after  you prepare raw meat. Wash all fruits and vegetables well before peeling or eating. Taking these actions can help to prevent food-borne illnesses that can be very dangerous to your baby, such as listeriosis. Ask your health care provider for more information about listeriosis. What does 150 extra calories look like? Healthy options that provide 150 extra calories each day could be any of the following:  6-8 oz (170-230 g) of plain low-fat yogurt with  cup of berries.  1 apple with 2 teaspoons (11 g) of peanut butter.  Cut-up vegetables with  cup (60 g) of hummus.  8 oz (230 mL) or 1 cup of low-fat chocolate milk.  1 stick of string cheese with 1 medium orange.  1 peanut butter and jelly sandwich that is made with one slice of whole-wheat bread and 1 tsp (5 g) of peanut butter. For 300 extra calories, you could eat two of those healthy options each day. What is a healthy amount of weight to gain? The right amount of weight gain for you is based on your BMI before you became pregnant. If your BMI:  Was less than 18 (underweight), you should gain 28-40 lb (13-18 kg).  Was 18-24.9 (normal), you should gain 25-35 lb (11-16 kg).  Was 25-29.9 (overweight), you should gain 15-25 lb (7-11 kg).  Was 30 or greater (obese), you should gain 11-20 lb (5-9 kg). What if I am having twins or multiples? Generally, if you  are carrying twins or multiples:  You may need to eat 300-600 extra calories a day.  The recommended range for total weight gain is 25-54 lb (11-25 kg), depending on your BMI before pregnancy.  Talk with your health care provider to find out about nutritional needs, weight gain, and exercise that is right for you. What foods can I eat?  Grains All grains. Choose whole grains, such as whole-wheat bread, oatmeal, or brown rice. Vegetables All vegetables. Eat a variety of colors and types of vegetables. Remember to wash your vegetables well before peeling or eating. Fruits All  fruits. Eat a variety of colors and types of fruit. Remember to wash your fruits well before peeling or eating. Meats and other protein foods Lean meats, including chicken, [place], fish, and lean cuts of beef, veal, or pork. If you eat fish or seafood, choose options that are higher in omega-3 fatty acids and lower in mercury, such as salmon, herring, mussels, trout, sardines, pollock, shrimp, crab, and lobster. Tofu. Tempeh. Beans. Eggs. Peanut butter and other nut butters. Make sure that all meats, poultry, and eggs are cooked to food-safe temperatures or "well-done." Two or more servings of fish are recommended each week in order to get the most benefits from omega-3 fatty acids that are found in seafood. Choose fish that are lower in mercury. You can find more information online:  PumpkinSearch.com.eewww.fda.gov Dairy Pasteurized milk and milk alternatives (such as almond milk). Pasteurized yogurt and pasteurized cheese. Cottage cheese. Sour cream. Beverages Water. Juices that contain 100% fruit juice or vegetable juice. Caffeine-free teas and decaffeinated coffee. Drinks that contain caffeine are okay to drink, but it is better to avoid caffeine. Keep your total caffeine intake to less than 200 mg each day (which is 12 oz or 355 mL of coffee, tea, or soda) or the limit as told by your health care provider. Fats and oils Fats and oils are okay to include in moderation. Sweets and desserts Sweets and desserts are okay to include in moderation. Seasoning and other foods All pasteurized condiments. The items listed above may not be a complete list of recommended foods and beverages. Contact your dietitian for more options. What foods are not recommended? Vegetables Raw (unpasteurized) vegetable juices. Fruits Unpasteurized fruit juices. Meats and other protein foods Lunch meats, bologna, hot dogs, or other deli meats. (If you must eat those meats, reheat them until they are steaming hot.) Refrigerated pat,  meat spreads from a meat counter, smoked seafood that is found in the refrigerated section of a store. Raw or undercooked meats, poultry, and eggs. Raw fish, such as sushi or sashimi. Fish that have high mercury content, such as tilefish, shark, swordfish, and king mackerel. To learn more about mercury in fish, talk with your health care provider or look for online resources, such as:  PumpkinSearch.com.eewww.fda.gov Dairy Raw (unpasteurized) milk and any foods that have raw milk in them. Soft cheeses, such as feta, queso blanco, queso fresco, Brie, Camembert cheeses, blue-veined cheeses, and Panela cheese (unless it is made with pasteurized milk, which must be stated on the label). Beverages Alcohol. Sugar-sweetened beverages, such as sodas, teas, or energy drinks. Seasoning and other foods Homemade fermented foods and drinks, such as pickles, sauerkraut, or kombucha drinks. (Store-bought pasteurized versions of these are okay.) Salads that are made in a store or deli, such as ham salad, chicken salad, egg salad, tuna salad, and seafood salad. The items listed above may not be a complete list of foods and beverages  to avoid. Contact your dietitian for more information. Where to find more information To calculate the number of calories you need based on your height, weight, and activity level, you can use an online calculator such as:  PackageNews.is To calculate how much weight you should gain during pregnancy, you can use an online pregnancy weight gain calculator such as:  http://jones-berg.com/ Summary  While you are pregnant, your body requires additional nutrition to help support your growing baby.  Eat a variety of foods, especially fruits and vegetables to get a full range of vitamins and minerals.  Practice good food safety and cleanliness. Wash your hands before you eat and after you prepare raw meat. Wash all fruits and vegetables well before peeling  or eating. Taking these actions can help to prevent food-borne illnesses, such as listeriosis, that can be very dangerous to your baby.  Do not eat raw meat or fish. Do not eat fish that have high mercury content, such as tilefish, shark, swordfish, and king mackerel. Do not eat unpasteurized (raw) dairy.  Take a prenatal vitamin to help meet your additional vitamin and mineral needs during pregnancy, specifically for folic acid, iron, calcium, and vitamin D. This information is not intended to replace advice given to you by your health care provider. Make sure you discuss any questions you have with your health care provider. Document Released: 11/18/2013 Document Revised: 10/31/2016 Document Reviewed: 10/31/2016 Elsevier Interactive Patient Education  2019 Elsevier Inc.  [employer] de peso saludable durante Firefighter en adultos Healthy [employer] During Pregnancy, Adult Un aumento de cierta cantidad de peso durante el embarazo es saludable y normal. La cantidad de peso que se espera que aumente depende de su salud general y de una medicin llamada IMC (ndice de masa muscular). [facility] proporciona una estimacin de la grasa corporal basndose en su peso y Barrister's clerk. Para calcular su IMC, puede usar una calculadora en lnea o puede pedirle a su mdico que lo calcule por usted en su prxima visita. La cantidad de peso que es recomendable aumentar depende de su IMC antes del embarazo. Las pautas generales para el aumento de peso saludable total durante el embarazo se detallan a continuacin. Si su IMC al comienzo o antes de su embarazo es de:  Menos de 18,5 (bajo peso), debe aumentar de 28 a 40 libras (13 a 18kg).  De 18,5 a 24,9 (normal), debe aumentar de 25 a 35libras (11 a 16kg).  De 25 a 29,9 (sobrepeso), debe aumentar de 15 a 25libras (7 a 11kg).  De 30 o ms (obesa), debe aumentar de 11 a 20libras (5 a 9kg). Estos rangos varan segn Administrator, arts. Si est embarazada de ms de un  beb (embarazo mltiple), puede ser seguro aumentar ms peso de lo que se recomienda aqu. Si aumenta menos peso de lo que se recomienda, podra ser seguro siempre y cuando su beb est creciendo y desarrollndose con normalidad. Cmo puede afectarme a m y a mi beb un aumento de peso no saludable? [place] Aliment demasiado de peso durante el embarazo puede producir complicaciones en la gestacin, por ejemplo:  Una forma temporal de diabetes que aparece durante el embarazo (diabetes gestacional).  Hipertensin arterial durante el embarazo y protenas en la orina (preeclampsia).  Hipertensin arterial durante el embarazo sin protenas en la orina (hipertensin gestacional).  Un beb con alto peso al nacer puede: ? Aumentar el riesgo de tener un parto ms difcil o necesitar un parto quirrgico (parto por cesrea). ? Aumentar el  riesgo de que el nio sufra de obesidad durante la niez. No aumentar el peso suficiente puede poner en peligro la vida del beb y puede aumentar las posibilidades de que el beb:  Nazca antes de tiempo (parto prematuro).  Crezca ms lento de lo normal durante el embarazo (restriccin del crecimiento).  Tenga bajo peso al [employer]. Qu puedo hacer para aumentar una cantidad saludable de peso durante el embarazo? Instrucciones generales  [employer] un seguimiento del aumento de peso durante el [place].  Tome los medicamentos de venta libre y los recetados solamente como se lo haya indicado el mdico. Tome todos los suplementos prenatales segn las indicaciones.  Concurra a todas las visitas de atencin durante el embarazo (visitas prenatales). Estas visitas son el momento adecuado para hablar sobre el aumento de [place]. Su mdico la pesar en cada visita para asegurarse de que est aumentando una cantidad de peso saludable. Nutricin   Consuma una dieta equilibrada y rica en nutrientes. Consuma gran cantidad de lo siguiente: ? Frutas y verduras, como frutos rojos y  brcoli. ? Cereales integrales, como mijo, cebada, pan de salvado o integral, cereales y avena. ? Productos lcteos descremados o productos no lcteos como leche de [place] o de Surveyor, minerals. ? Alimentos proteicos como carnes [place], pollo, [place] y legumbres (como guisantes, frijoles, porotos de soja, y Therapist, occupational).  Evite alimentos fritos o que contengan mucha grasa, sal (sodio) o azcar.  Beba suficiente lquido para Photographer orina de color amarillo plido.  Elija bocadillos y bebidas saludables cuando est en el trabajo o fuera de casa: ? Beba agua. Evite los refrescos, las bebidas deportivas y los jugos que contengan azcar aadido. ? Evite las bebidas con cafena, como el caf y las bebidas energizantes. ? Coma colaciones con alto contenido proteico, como nueces, barras proteicas y yogures descremados. ? Lleve consigo bocadillos prcticos que no necesiten refrigeracin, como un paquete de frutos secos, una manzana o una barra de granola.  Si necesita ayuda para mejorar su dieta, hable con su mdico o con un especialista en nutricin(nutricionista). Actividad   Realice ejercicio con regularidad como se lo haya indicado el mdico. ? Si era una persona activa antes de quedar [place], es posible que pueda continuar sus actividades fsicas habituales. ? Si no era activa antes del embarazo, podra gradualmente llegar a hacer de actividad o ms la [employer] de la [place]. Las actividades pueden incluir caminar, Designer, industrial/product yoga.  Pregntele al mdico qu actividades son seguras para usted. Hable con su mdico sobre si debera Social research officer, government en la escuela o en el trabajo. Dnde encontrar ms informacin: Infrmese sobre cmo Dietitian aumento de peso durante el embarazo en:  Asociacin Americana del [place] (American Pregnancy Association): BroadwayMovies.se.  Biomedical engineer de aumento de peso en el embarazo del Departamento de Agricultura  de los [place]: https://ball-collins.biz/ Resumen  Subir demasiado de peso durante el embarazo puede causar complicaciones para usted y para su beb.  Averige su IMC antes del embarazo para determinar el aumento de peso que es saludable para usted.  Consuma alimentos nutritivos y [employer].  Concurra a todas las visitas prenatales tal como se lo haya indicado el mdico. Esta informacin no tiene Theme park manager el consejo del mdico. Asegrese de hacerle al mdico cualquier pregunta que tenga. Document Released: 07/19/2015 Document Revised: 01/25/2017 Document Reviewed: 01/25/2017 Elsevier Interactive Patient Education  2019 Elsevier Inc.  [place] e hipotiroidismo Pregnancy and Hypothyroidism El hipotiroidismo es una afeccin que  aparece si la glndula tiroidea tiene baja actividad. La tiroides es una glndula pequea con forma de mariposa que se encuentra en el cuello. Est ubicada delante de la trquea. Hace que las hormonas desempeen una funcin importante en la regulacin de la respiracin, la frecuencia cardaca, el ciclo menstrual, la Arts development officer y otras funciones del cuerpo. Si tiene hipotiroidismo, la glndula tiroidea no produce suficientes hormonas tiroideas. Cuando est embarazada, el cuerpo utiliza ms hormonas tiroideas. Esto hace que el hipotiroidismo leve empeore. Jill Alexanders modo me afecta? El hipotiroidismo durante el embarazo puede causar:  [place].  Aumento anormal de peso. En el caso de las mujeres con peso normal, es frecuente aumentar aproximadamente 1libra por semana durante el [place].  Dificultad para defecar (estreimiento).  Sentir fro con ms frecuencia que otros.  Dolores musculares.  Complicaciones en el embarazo, como por ejemplo: ? Presin arterial alta que se presenta despus de la semana 20 de embarazo (preeclampsia). ? Prdida del embarazo (aborto espontneo). ? Nacimiento prematuro. ? Problemas en la placenta. Cmo  afecta esto al beb? El hipotiroidismo tambin puede afectar al beb. Los bebs necesitan las hormonas tiroideas de su madre para un crecimiento y un desarrollo del cerebro normales. Es posible que los bebs que nacen de madres con hipotiroidismo durante el embarazo:  Sports coach.  Tengan muy bajo peso al nacer.  Tengan retrasos mentales.  Desarrollen hipotiroidismo. Esto es poco frecuente. Qu puedo hacer para disminuir el riesgo? Algunas mujeres con hipotiroidismo necesitan ms yodo durante el embarazo. El mdico puede recomendarle lo siguiente:  Comer alimentos que contengan yodo, como: ? Sal yodada. ? Huevos y productos lcteos pasteurizados. ? Mariscos con bajo contenido de [employer].  Tomar una vitamina prenatal que contenga yodo.  Tomar suplementos de yodo. Cmo se trata? El tratamiento puede incluir lo siguiente:  Control. Si tiene hipotiroidismo leve, el mdico controlar [employer] de hormonas tiroideas para ver si hay algn cambio.  Medicamentos. El mdico puede recetarle medicamentos para controlar los niveles de la hormona tiroidea. Siga estas indicaciones en su casa:  Tome los medicamentos de venta libre y los recetados solamente como se lo haya indicado el mdico. ? Consulte con el mdico antes de [employer] para el hipotiroidismo que le recetaron antes de quedar embarazada. Muchos son seguros, pero es posible que algunos tratamientos para el hipotiroidismo deban interrumpirse Academic librarian.  Se le puede pedir que realice conteos de las patadas para controlar los movimientos del beb. Si el beb se mueve menos de 10 veces en 2 horas durante un perodo en el que este suele estar activo (en general, a la noche), debe ir al mdico de inmediato.  Tome una vitamina prenatal como se lo haya indicado el mdico.  Cumpla con todas las visitas de control. Esto es importante. Comunquese con un mdico si:  Tiene nuevos sntomas o  los existentes empeoran.  Tiene un aumento de peso de 5libras (2,3kilogramos) en 1 semana.  Tiene un bulto en el cuello.  Tiene la garganta irritada o dificultad para hablar que dura ms de un mes y no est relacionada con un resfro.  Tiene dificultad para tragar. Solicite ayuda de inmediato si:  El beb est menos activo de lo normal.  El beb deja de moverse por completo.  Presenta calambres musculares.  Siente dolor en el abdomen.  Tiene sangrado abundante.  Tiene escalofros o fiebre.  Tiene un dolor de cabeza muy intenso o problemas de visin.  Presenta hinchazn en las piernas y  los tobillos. Resumen  El hipotiroidismo es una afeccin que aparece si la glndula tiroidea tiene baja [place].  El hipotiroidismo durante el embarazo puede causar complicaciones para usted y el beb.  [employer], las vitaminas o los suplementos durante el embarazo para Chief Operating Officer su afeccin, segn las indicaciones del mdico.  Cumpla con las citas prenatales regulares de modo que el mdico pueda controlar su afeccin atentamente durante el [place]. Esta informacin no tiene Theme park manager el consejo del mdico. Asegrese de hacerle al mdico cualquier pregunta que tenga. Document Released: 11/24/2012 Document Revised: 04/10/2017 Document Reviewed: 04/10/2017 Elsevier Interactive Patient Education  2019 [employer].

## 2018-06-11 LAB — CERVICOVAGINAL ANCILLARY ONLY
Bacterial vaginitis: NEGATIVE
Candida vaginitis: NEGATIVE
Chlamydia: NEGATIVE
Neisseria Gonorrhea: NEGATIVE
Trichomonas: NEGATIVE

## 2018-06-11 LAB — OBSTETRIC PANEL, INCLUDING HIV
Antibody Screen: NEGATIVE
Basophils Absolute: 0 10*3/uL (ref 0.0–0.2)
Basos: 1 %
EOS (ABSOLUTE): 0 10*3/uL (ref 0.0–0.4)
Eos: 1 %
HIV Screen 4th Generation wRfx: NONREACTIVE
Hematocrit: 37.2 % (ref 34.0–46.6)
Hemoglobin: 12.7 g/dL (ref 11.1–15.9)
Hepatitis B Surface Ag: NEGATIVE
Immature Grans (Abs): 0 10*3/uL (ref 0.0–0.1)
Immature Granulocytes: 1 %
Lymphocytes Absolute: 1.3 10*3/uL (ref 0.7–3.1)
Lymphs: 20 %
MCH: 28.9 pg (ref 26.6–33.0)
MCHC: 34.1 g/dL (ref 31.5–35.7)
MCV: 85 fL (ref 79–97)
Monocytes Absolute: 0.4 10*3/uL (ref 0.1–0.9)
Monocytes: 6 %
Neutrophils Absolute: 4.7 10*3/uL (ref 1.4–7.0)
Neutrophils: 71 %
Platelets: 256 10*3/uL (ref 150–450)
RBC: 4.4 x10E6/uL (ref 3.77–5.28)
RDW: 13.5 % (ref 11.7–15.4)
RPR Ser Ql: NONREACTIVE
Rh Factor: POSITIVE
Rubella Antibodies, IGG: 19.8 index (ref >0.99)
WBC: 6.6 10*3/uL (ref 3.4–10.8)

## 2018-06-12 LAB — URINE CULTURE, OB REFLEX

## 2018-06-12 LAB — CULTURE, OB URINE

## 2018-06-14 ENCOUNTER — Encounter: Payer: Self-pay | Admitting: General Practice

## 2018-06-17 ENCOUNTER — Encounter: Payer: Self-pay | Admitting: General Practice

## 2018-06-18 ENCOUNTER — Encounter: Payer: Self-pay | Admitting: General Practice

## 2018-06-29 ENCOUNTER — Telehealth: Payer: Self-pay | Admitting: General Practice

## 2018-06-29 NOTE — Telephone Encounter (Signed)
Patient aware of [place] appt scheduled on 08/02/2018 at 10:00am with [facility].  Interpreter used for this call.

## 2018-07-21 ENCOUNTER — Ambulatory Visit (INDEPENDENT_AMBULATORY_CARE_PROVIDER_SITE_OTHER): Payer: Self-pay | Admitting: Primary Care

## 2018-08-05 ENCOUNTER — Ambulatory Visit (INDEPENDENT_AMBULATORY_CARE_PROVIDER_SITE_OTHER): Payer: Self-pay | Admitting: Obstetrics and Gynecology

## 2018-08-05 ENCOUNTER — Encounter: Payer: Self-pay | Admitting: Obstetrics and Gynecology

## 2018-08-05 ENCOUNTER — Other Ambulatory Visit: Payer: Self-pay

## 2018-08-05 VITALS — BP 102/66 | HR 87

## 2018-08-05 DIAGNOSIS — Z3482 Encounter for supervision of other normal pregnancy, second trimester: Secondary | ICD-10-CM

## 2018-08-05 DIAGNOSIS — G43009 Migraine without aura, not intractable, without status migrainosus: Secondary | ICD-10-CM

## 2018-08-05 DIAGNOSIS — Z3480 Encounter for supervision of other normal pregnancy, unspecified trimester: Secondary | ICD-10-CM

## 2018-08-05 DIAGNOSIS — Z3A20 20 weeks gestation of pregnancy: Secondary | ICD-10-CM

## 2018-08-05 MED ORDER — CYCLOBENZAPRINE HCL 10 MG PO TABS: 10.0000 mg | ORAL_TABLET | Freq: Three times a day (TID) | ORAL | 1 refills | Status: DC | PRN

## 2018-08-05 NOTE — Progress Notes (Signed)
   [facility] VIRTUAL OBSTETRICS VISIT ENCOUNTER NOTE - [employer] [phone] used for entire visit.  I connected with [name] on 08/05/18 at 10:30 AM EDT by Webex at home and verified that I am speaking with the correct person using two identifiers.    I discussed the limitations, risks, security and privacy concerns of performing an evaluation and management service by Webex and the availability of in person appointments. I also discussed with the patient that there may be a patient responsible charge related to this service. The patient expressed understanding and agreed to proceed.  Subjective:  Joanne [name] is a 38 y.o. G3P2002 at [redacted]w[redacted]d being followed for ongoing prenatal care.  She is currently monitored for the following issues for this low-risk pregnancy and has Hypothyroidism complicating pregnancy; Supervision of other normal pregnancy, antepartum; and Renal disease during pregnancy on their problem list.  Patient reports headache and "really bad H/As for 2 wks, but this week has been less. Takes 2 Tylenol with minimal to no relief. H/As cause dizziness, photophobia and blurry vision when they occur. No h/o PEC with previous pregnancies. No epigastric pain.. Reports "a little" fetal movement. Denies any contractions, bleeding or leaking of fluid.   The following portions of the patient's history were reviewed and updated as appropriate: allergies, current medications, past family history, past medical history, past social history, past surgical history and problem list.   Objective:   General:  Alert, oriented and cooperative.   Mental Status: Normal mood and affect perceived. Normal judgment and thought content.  Rest of physical exam deferred due to type of encounter BP 102/66   Pulse 87   LMP 03/14/2018 (Approximate)   ** BP taken by patient's at-home BP cuff**  Assessment and Plan:  Pregnancy: G3P2002 at [redacted]w[redacted]d Migraine without aura and without status  migrainosus, not intractable  - Rx for cyclobenzaprine (FLEXERIL) 10 MG tablet - Information provided on migraine  - Advised that if Flexeril does not relieve pain of H/A she is to call immediately and will be asked to go to MAU for evaluation. - No concern right now of PEC d/t BPs of 93/59 & 102/66 today. - Discussed s/s to look for with PEC  Supervision of other normal pregnancy, antepartum    Preterm labor symptoms and general obstetric precautions including but not limited to vaginal bleeding, contractions, leaking of fluid and fetal movement were reviewed in detail with the patient.  I discussed the assessment and treatment plan with the patient. The patient was provided an opportunity to ask questions and all were answered. The patient agreed with the plan and demonstrated an understanding of the instructions. The patient was advised to call back or seek an in-person office evaluation/go to MAU at [facility] for any urgent or concerning symptoms. Please refer to After Visit Summary for other counseling recommendations.   I provided 5 minutes of non-face-to-face time during this encounter. There was 10 minutes of chart review time spent prior to this encounter. Total time spent = 15 minutes.   Return in about 4 weeks (around 09/02/2018) for Return OB - webex/telehealth.  Future Appointments  Date Time Provider [place]  09/02/2018 10:30 AM Nugent, Gerrie Nordmann, NP [facility] None  09/30/2018  8:10 AM Laury Deep, CNM [facility] None    Laury Deep, [place] for [employer], [place]

## 2018-08-05 NOTE — Patient Instructions (Addendum)
Cefalea migraosa  Migraine Headache    Una cefalea migraosa es un dolor intenso y punzante en uno o ambos lados de la cabeza. Las migraas tambin pueden causar otros sntomas, como nuseas, vmitos y sensibilidad a la luz y el ruido.  Cules son las causas?  Hay ciertos factores que pueden provocar migraas, como los siguientes:   Alcohol.   Fumar.   Medicamentos, por ejemplo:  ? Medicamentos para aliviar el dolor torcico (nitroglicerina).  ? Pldoras anticonceptivas.  ? Comprimidos de estrgeno.  ? Ciertos medicamentos para la presin arterial.   Quesos curados, chocolate o cafena.   Los alimentos o las bebidas que contienen nitratos, glutamato, aspartamo o tiramina.   Realizar actividad fsica.  Otros factores que pueden provocar migraa incluyen los siguientes:   La menstruacin   Embarazo.   Hambre.   Estrs, poco o demasiado sueo, o fatiga.   Los cambios climticos.  Qu incrementa el riesgo?  Los siguientes factores pueden hacer que usted sea ms propenso a tener migraas:   La edad. Los riesgos aumentan con la edad.   Antecedentes familiares de migraa.   Ser de raza caucsica.   Depresin y ansiedad.   Obesidad.   Ser mujer.   Tener un agujero en el corazn (persistencia del agujero oval) u otros problemas cardacos.  Cules son los signos o los sntomas?  El principal sntoma de esta afeccin es el dolor intenso y punzante. El dolor:   Puede aparecer en cualquier regin de la cabeza, tanto de un lado como de ambos.   Puede interferir con las actividades de la vida cotidiana.   Puede empeorar con la actividad fsica.   Puede empeorar ante la exposicin a luces brillantes o a ruidos fuertes.  Otros sntomas pueden incluir lo siguiente:   Nuseas.   Vmitos.   Mareos.   Sensibilidad general a las luces brillantes, a los ruidos fuertes o a los olores.  Antes de sufrir una migraa, puede percibir seales de advertencia (aura). Un aura puede incluir:   Ver luces intermitentes o  tener puntos ciegos.   Ver puntos brillantes, halos o lneas en zigzag.   Tener una visin en tnel o visin borrosa.   Sentir entumecimiento u hormigueo.   Tener dificultad para hablar.   Debilidad muscular.  Cmo se diagnostica?  La cefalea migraosa se diagnostica en funcin de lo siguiente:   Sus sntomas.   Un examen fsico.   Estudios, como una tomografa computarizada o una resonancia magntica de la cabeza. Estos estudios de diagnstico por imgenes pueden ayudar a descartar otras causas de cefalea.   Una muestra de lquido cefalorraqudeo (puncin lumbar) para analizar (anlisis de lquido cefalorraqudeo o anlisis de LCR).  Cmo se trata?  Las cefaleas migraosas suelen tratarse con medicamentos que:   Alivian el dolor.   Alivian las nuseas.   Evitan la recurrencia de las migraas.  El tratamiento tambin puede incluir lo siguiente:   Acupuntura.   Cambios en el estilo de vida, como evitar alimentos que provoquen migraa.  Siga estas instrucciones en su casa:  Medicamentos   Tome los medicamentos de venta libre y los recetados solamente como se lo haya indicado el mdico.   No conduzca ni use maquinaria pesada mientras toma analgsicos recetados.   A fin de prevenir o tratar el estreimiento mientras toma analgsicos recetados, el mdico puede recomendarle lo siguiente:  ? Beba suficiente lquido para mantener la orina clara o de color amarillo plido.  ? Tomar medicamentos recetados o   de venta libre.  ? Consumir alimentos ricos en fibra, como frutas y verduras frescas, cereales integrales y frijoles.  ? Limitar el consumo de alimentos con alto contenido de grasas y azcares procesados, como alimentos fritos o dulces.  Estilo de vida   Evite el consumo de alcohol.   No consuma ningn producto que contenga nicotina o tabaco, como cigarrillos y cigarrillos electrnicos. Si necesita ayuda para dejar de fumar, consulte al mdico.   Duerma como mnimo 8horas todas las noches.   Evite  las situaciones de estrs.  Instrucciones generales     Lleve un registro diario para averiguar lo que puede provocar las cefaleas migraosas. Por ejemplo, escriba:  ? Lo que usted come y bebe.  ? Cunto tiempo duerme.  ? Algn cambio en su dieta o en los medicamentos.   Si tiene una migraa:  ? Evite los factores que empeoren los sntomas, como las luces brillantes.  ? Resulta til acostarse en una habitacin oscura y silenciosa.  ? No conduzca vehculos ni opere maquinaria pesada.  ? Pregntele al mdico qu actividades son seguras para usted cuando tiene sntomas.   Concurra a todas las visitas de control como se lo haya indicado el mdico. Esto es importante.  Comunquese con un mdico si:   Tiene sntomas de migraa distintos o ms intensos que los habituales.  Solicite ayuda de inmediato si:   La migraa se hace cada vez ms intensa.   Tiene fiebre.   Presenta rigidez en el cuello.   Tiene prdida de visin.   Siente debilidad en los msculos o que no puede controlarlos.   Comienza a perder el equilibrio con frecuencia.   Comienza a tener dificultades para caminar.   Se desmaya.  Esta informacin no tiene como fin reemplazar el consejo del mdico. Asegrese de hacerle al mdico cualquier pregunta que tenga.  Document Released: 02/03/2005 Document Revised: 05/13/2016 Document Reviewed: 07/23/2015  Elsevier Interactive Patient Education  2019 [employer]

## 2018-08-25 ENCOUNTER — Other Ambulatory Visit: Payer: Self-pay

## 2018-08-25 ENCOUNTER — Inpatient Hospital Stay (HOSPITAL_BASED_OUTPATIENT_CLINIC_OR_DEPARTMENT_OTHER): Payer: Self-pay

## 2018-08-25 ENCOUNTER — Inpatient Hospital Stay (HOSPITAL_COMMUNITY)
Admission: AD | Admit: 2018-08-25 | Discharge: 2018-08-25 | Disposition: A | Discharge status: Home / Self Care | Payer: Self-pay | Attending: Obstetrics and Gynecology | Admitting: Obstetrics and Gynecology

## 2018-08-25 ENCOUNTER — Encounter (HOSPITAL_COMMUNITY): Payer: Self-pay | Admitting: *Deleted

## 2018-08-25 DIAGNOSIS — O288 Other abnormal findings on antenatal screening of mother: Secondary | ICD-10-CM

## 2018-08-25 DIAGNOSIS — O360920 Maternal care for other rhesus isoimmunization, second trimester, not applicable or unspecified: Secondary | ICD-10-CM

## 2018-08-25 DIAGNOSIS — R109 Unspecified abdominal pain: Secondary | ICD-10-CM

## 2018-08-25 DIAGNOSIS — O34219 Maternal care for unspecified type scar from previous cesarean delivery: Secondary | ICD-10-CM

## 2018-08-25 DIAGNOSIS — Z3A23 23 weeks gestation of pregnancy: Secondary | ICD-10-CM

## 2018-08-25 DIAGNOSIS — O99282 Endocrine, nutritional and metabolic diseases complicating pregnancy, second trimester: Secondary | ICD-10-CM

## 2018-08-25 DIAGNOSIS — O26892 Other specified pregnancy related conditions, second trimester: Secondary | ICD-10-CM

## 2018-08-25 DIAGNOSIS — E039 Hypothyroidism, unspecified: Secondary | ICD-10-CM

## 2018-08-25 DIAGNOSIS — O4690 Antepartum hemorrhage, unspecified, unspecified trimester: Secondary | ICD-10-CM

## 2018-08-25 DIAGNOSIS — O9989 Other specified diseases and conditions complicating pregnancy, childbirth and the puerperium: Secondary | ICD-10-CM

## 2018-08-25 DIAGNOSIS — O4692 Antepartum hemorrhage, unspecified, second trimester: Secondary | ICD-10-CM

## 2018-08-25 DIAGNOSIS — O289 Unspecified abnormal findings on antenatal screening of mother: Secondary | ICD-10-CM

## 2018-08-25 DIAGNOSIS — Z6790 Unspecified blood type, Rh positive: Secondary | ICD-10-CM

## 2018-08-25 DIAGNOSIS — O2692 Pregnancy related conditions, unspecified, second trimester: Secondary | ICD-10-CM

## 2018-08-25 LAB — URINALYSIS, ROUTINE W REFLEX MICROSCOPIC
Bilirubin Urine: NEGATIVE
Glucose, UA: NEGATIVE mg/dL
Ketones, ur: NEGATIVE mg/dL
Leukocytes,Ua: NEGATIVE
Nitrite: NEGATIVE
Protein, ur: NEGATIVE mg/dL
Specific Gravity, Urine: 1.004 — ABNORMAL LOW (ref 1.005–1.030)
pH: 8 (ref 5.0–8.0)

## 2018-08-25 LAB — CBC WITH DIFFERENTIAL/PLATELET
Abs Immature Granulocytes: 0.08 10*3/uL — ABNORMAL HIGH (ref 0.00–0.07)
Basophils Absolute: 0 10*3/uL (ref 0.0–0.1)
Basophils Relative: 0 %
Eosinophils Absolute: 0.2 10*3/uL (ref 0.0–0.5)
Eosinophils Relative: 2 %
HCT: 30.4 % — ABNORMAL LOW (ref 36.0–46.0)
Hemoglobin: 10.1 g/dL — ABNORMAL LOW (ref 12.0–15.0)
Immature Granulocytes: 1 %
Lymphocytes Relative: 15 %
Lymphs Abs: 1.3 10*3/uL (ref 0.7–4.0)
MCH: 28.3 pg (ref 26.0–34.0)
MCHC: 33.2 g/dL (ref 30.0–36.0)
MCV: 85.2 fL (ref 80.0–100.0)
Monocytes Absolute: 0.5 10*3/uL (ref 0.1–1.0)
Monocytes Relative: 6 %
Neutro Abs: 6.5 10*3/uL (ref 1.7–7.7)
Neutrophils Relative %: 76 %
Platelets: 284 10*3/uL (ref 150–400)
RBC: 3.57 MIL/uL — ABNORMAL LOW (ref 3.87–5.11)
RDW: 13.5 % (ref 11.5–15.5)
WBC: 8.5 10*3/uL (ref 4.0–10.5)
nRBC: 0 % (ref 0.0–0.2)

## 2018-08-25 LAB — WET PREP, GENITAL
Clue Cells Wet Prep HPF POC: NONE SEEN
Sperm: NONE SEEN
Trich, Wet Prep: NONE SEEN
Yeast Wet Prep HPF POC: NONE SEEN

## 2018-08-25 MED ORDER — COMFORT FIT MATERNITY SUPP SM MISC: 1.0000 [IU] | Freq: Every day | 0 refills | Status: DC | PRN

## 2018-08-25 NOTE — MAU Note (Signed)
Pt had some n/v this morning. After she felt wet and checked an there was dark red/brown blood in her underwear and when she wiped. also c/o lower abd pressure and cramping ressue is constant. State that she had felt baby move a lot at 5am but not much since.  Pt also stated that for 2 weeks her feet have felt tingly and tired.

## 2018-08-25 NOTE — MAU Provider Note (Signed)
History     CSN: [id number]  Arrival date and time: 08/25/18 [id number]   First Provider Initiated Contact with Patient 08/25/18 1039      Chief Complaint  Patient presents with   Vaginal Bleeding   Abdominal Pain   Ms. Joanne [name] is a 38 y.o. G3P2002 at 7662w3d who presents to MAU for vaginal bleeding which began this morning around 0830. Pt reports she had N/V this morning like usual, and while she was straining while vomiting she felt something come out of her vagina and thought it was urine. Pt removed her underwear and saw blood. Pt describes the bleeding as "wet" and red and reports a couple of drops of blood fell on the floor. Pt denies any fluid leaking along with the bleeding.  Pt also reporting that she feels like her stomach gets hard and makes her feel like she has to urinate. Pt reports she feels like she is having menstrual cramps which start mid-line in her pelvis and wrap around to her back. Pt reports her lower abdomen gets very hard about every 5-558minutes, which started around 0830 this morning as well. Pt is also reporting pain on the bones inside her pelvis.  Pt also reports that her legs are feeling tingly for the past two weeks only at night, but today it happened in the AM.  Passing blood clots? no Blood soaking clothes? no Lightheaded/dizzy? no Significant pelvic pain or cramping/ctx? see above Passed any tissue? no Recent trauma? no Prior abruption? no Smoking/drug use? no HTN? no PPROM? no Current pregnancy problems? pelvic pressure (first started 3weeks ago, pt reports is the same today), hypothyroid, HA x37mo Hx of C/S or GYN surgery? C/S x1 Hx of recurrent pregnancy loss/associated condition? no Blood Type? O Positive Allergies? NKDA (pt confirmed with interpreter that she is not allergic to NSAIDs or anything else) Current medications? Thyroid medications, PNVs, Flexeril PRN Current [facility] & next appt? [facility], 09/02/2018  Spanish interpretation  services used for entire visit.   OB History    Gravida  3   Para  2   Term  2   Preterm      AB      Living  2     SAB      TAB      Ectopic      Multiple  0   Live Births  2           Past Medical History:  Diagnosis Date   Chronic kidney disease    Pt has only one kidney, pt. donated a kidney. Pt. not having any problems   Hypothyroid    Hypothyroidism    Postpartum hemorrhage 09/22/2014   EBL 851    VBAC (vaginal birth after Cesarean) 09/22/2014    Past Surgical History:  Procedure Laterality Date   CESAREAN SECTION  12/23/2010   Procedure: CESAREAN SECTION;  Surgeon: Kathreen CosierBernard A Marshall, MD;  Location: [facility];  Service: Gynecology;  Laterality: N/A;   donated kidney      Family History  Problem Relation Age of Onset   Diabetes Maternal Aunt     Social History   Tobacco Use   Smoking status: Never Smoker   Smokeless tobacco: Never Used  Substance Use Topics   Alcohol use: No   Drug use: No    Allergies:  Allergies  Allergen Reactions   Nsaids Other (See Comments)    Patient with one kidney, avoid NSAIDS    Medications Prior to Admission  Medication Sig Dispense Refill Last Dose   levothyroxine (SYNTHROID, LEVOTHROID) 75 MCG tablet Take 1 tablet (75 mcg total) by mouth daily. 90 tablet 3 08/25/2018 at Unknown time   cyclobenzaprine (FLEXERIL) 10 MG tablet Take 1 tablet (10 mg total) by mouth every 8 (eight) hours as needed for muscle spasms. 30 tablet 1    Magnesium 200 MG TABS Take 2 tablets (400 mg total) by mouth at bedtime. 30 each 2    metoCLOPramide (REGLAN) 10 MG tablet Take 1 tablet (10 mg total) by mouth every 8 (eight) hours as needed for nausea. 30 tablet 0    promethazine (PHENERGAN) 25 MG tablet Take 1 tablet (25 mg total) by mouth every 6 (six) hours as needed for nausea or vomiting. 30 tablet 1     Review of Systems  Constitutional: Negative for chills, diaphoresis, fatigue and fever.  Respiratory: Negative  for shortness of breath.   Cardiovascular: Negative for chest pain.  Gastrointestinal: Negative for abdominal pain, constipation, diarrhea, nausea and vomiting.  Genitourinary: Positive for pelvic pain and vaginal bleeding. Negative for dysuria, flank pain, frequency, urgency and vaginal discharge.  Neurological: Negative for dizziness, weakness, light-headedness and headaches.   Physical Exam   Blood pressure 113/63, pulse 76, temperature 98.7 F (37.1 C), resp. rate 18, height 5\' 1"  (1.549 m), weight 66.7 kg, last menstrual period 03/14/2018.  Patient Vitals for the past 24 hrs:  BP Temp Pulse Resp Height Weight  08/25/18 0948 113/63 98.7 F (37.1 C) 76 18 5\' 1"  (1.549 m) 66.7 kg   Physical Exam  Constitutional: She is oriented to person, place, and time. She appears well-developed and well-nourished. No distress.  HENT:  Head: Normocephalic and atraumatic.  Respiratory: Effort normal.  GI: Soft. She exhibits no distension and no mass. There is abdominal tenderness in the suprapubic area. There is no rebound and no guarding.  Genitourinary: There is no rash, tenderness or lesion on the right labia. There is no rash, tenderness or lesion on the left labia. Uterus is enlarged. Uterus is not tender. Cervix exhibits no motion tenderness, no discharge and no friability. Right adnexum displays no mass, no tenderness and no fullness. Left adnexum displays no mass, no tenderness and no fullness.    No vaginal discharge, tenderness or bleeding.  No tenderness or bleeding in the vagina.       Genitourinary Comments: External os appears visually closed   Musculoskeletal: Normal range of motion.  Neurological: She is alert and oriented to person, place, and time.  Skin: Skin is warm and dry. She is not diaphoretic.  Psychiatric: She has a normal mood and affect. Her behavior is normal. Judgment and thought content normal.   Results for orders placed or performed during the hospital encounter  of 08/25/18 (from the past 24 hour(s))  Urinalysis, Routine w reflex microscopic     Status: Abnormal   Collection Time: 08/25/18 10:06 AM  Result Value Ref Range   Color, Urine STRAW (A) YELLOW   APPearance CLEAR CLEAR   Specific Gravity, Urine 1.004 (L) 1.005 - 1.030   pH 8.0 5.0 - 8.0   Glucose, UA NEGATIVE NEGATIVE mg/dL   Hgb urine dipstick MODERATE (A) NEGATIVE   Bilirubin Urine NEGATIVE NEGATIVE   Ketones, ur NEGATIVE NEGATIVE mg/dL   Protein, ur NEGATIVE NEGATIVE mg/dL   Nitrite NEGATIVE NEGATIVE   Leukocytes,Ua NEGATIVE NEGATIVE   RBC / HPF 0-5 0 - 5 RBC/hpf   WBC, UA 0-5 0 - 5 WBC/hpf  Bacteria, UA RARE (A) NONE SEEN   Squamous Epithelial / LPF 0-5 0 - 5  Wet prep, genital     Status: Abnormal   Collection Time: 08/25/18 11:10 AM   Specimen: Vaginal  Result Value Ref Range   Yeast Wet Prep HPF POC NONE SEEN NONE SEEN   Trich, Wet Prep NONE SEEN NONE SEEN   Clue Cells Wet Prep HPF POC NONE SEEN NONE SEEN   WBC, Wet Prep HPF POC MANY (A) NONE SEEN   Sperm NONE SEEN   CBC with Differential/Platelet     Status: Abnormal   Collection Time: 08/25/18 12:45 PM  Result Value Ref Range   WBC 8.5 4.0 - 10.5 K/uL   RBC 3.57 (L) 3.87 - 5.11 MIL/uL   Hemoglobin 10.1 (L) 12.0 - 15.0 g/dL   HCT 16.1 (L) 09.6 - 04.5 %   MCV 85.2 80.0 - 100.0 fL   MCH 28.3 26.0 - 34.0 pg   MCHC 33.2 30.0 - 36.0 g/dL   RDW 40.9 81.1 - 91.4 %   Platelets 284 150 - 400 K/uL   nRBC 0.0 0.0 - 0.2 %   Neutrophils Relative % 76 %   Neutro Abs 6.5 1.7 - 7.7 K/uL   Lymphocytes Relative 15 %   Lymphs Abs 1.3 0.7 - 4.0 K/uL   Monocytes Relative 6 %   Monocytes Absolute 0.5 0.1 - 1.0 K/uL   Eosinophils Relative 2 %   Eosinophils Absolute 0.2 0.0 - 0.5 K/uL   Basophils Relative 0 %   Basophils Absolute 0.0 0.0 - 0.1 K/uL   Immature Granulocytes 1 %   Abs Immature Granulocytes 0.08 (H) 0.00 - 0.07 K/uL   [place] Mfm Ob Limited  Result Date:  08/25/2018 ----------------------------------------------------------------------  OBSTETRICS REPORT                       (Signed Final 08/25/2018 02:13 pm) ---------------------------------------------------------------------- Patient Info  ID #:       [id number]                          D.O.B.:  [birth year] (38 yrs)  Name:       Joanne [name]              Visit Date: 08/25/2018 11:46 am ---------------------------------------------------------------------- Performed By  Performed By:     Percell Boston          Referred By:      MAU Nursing-                    RDMS                                     MAU/Triage  Attending:        Lin Landsman      [facility] ---------------------------------------------------------------------- Orders   #  Description  Code         Ordered By   1  [place] MFM OB LIMITED                    [id number]     Dellie Piasecki  ----------------------------------------------------------------------   #  Order #                    Accession #                 Episode #   1  [id number]                  [id number]                  [id number]  ---------------------------------------------------------------------- Indications   [redacted] weeks gestation of pregnancy                Z3A.23   Vaginal bleeding in pregnancy, second          O46.92   trimester   Abdominal pain in pregnancy                    O99.89   Previous cesarean delivery, antepartum x 1     F09.323   Medical complication of pregnancy              O26.90   (unilateral kidney, donation)   Hypothyroid                                    O99.280 E03.9  ---------------------------------------------------------------------- Fetal Evaluation  Num Of Fetuses:         1  Fetal Heart Rate(bpm):  138  Cardiac Activity:       Observed  Presentation:           Transverse, head to maternal right  Placenta:               Anterior  P. Cord  Insertion:      Visualized, central  Amniotic Fluid  AFI FV:      Within normal limits                              Largest Pocket(cm)                              7.34  Comment:    No placental abruption or previa identified. ---------------------------------------------------------------------- OB History  Gravidity:    3         Term:   2        Prem:   0        SAB:   0  TOP:          0       Ectopic:  0        Living: 2 ---------------------------------------------------------------------- Gestational Age  LMP:           23w 3d        Date:  03/14/18                 EDD:   12/19/18  Best:          23w 3d     Det. By:  LMP  (03/14/18)  EDD:   12/19/18 ---------------------------------------------------------------------- Anatomy  Thoracic:              Appears normal         Bladder:                Appears normal  Stomach:               Appears normal, left                         sided ---------------------------------------------------------------------- Cervix Uterus Adnexa  Cervix  Length:            3.1  cm.  Normal appearance by transabdominal scan.  Uterus  No abnormality visualized.  Left Ovary  No adnexal mass visualized.  Right Ovary  No adnexal mass visualized.  Cul De Sac  No free fluid seen.  Adnexa  No abnormality visualized. ---------------------------------------------------------------------- Impression  Limited exam  Vaginal bleeding  No evidence of placenta previa or abruption ---------------------------------------------------------------------- Recommendations  Follow up as clinically indicated. ----------------------------------------------------------------------               Joanne Landsmanorenthian Booker, MD Electronically Signed Final Report   08/25/2018 02:13 pm ----------------------------------------------------------------------   MAU Course  Procedures  MDM -r/o PTL/abruption -minute blood mixed with cervical mucus on exam -no pooling of fluid on SSE, no fluid draining from  os -pt reported tightening of stomach while provider was in the room, no ctx palpated, abdomen remained soft -no abnormal gate while pt is ambulating -pelvic pressure unchanged since start 3weeks ago, per pt report -no US on file, cervical check deferred until after results of limited US are received -ABO: O Positive -EFM: reassuring but not r eactive       -baseline: 150       -variability: moderate       -accels: present, not reaching 10x10 threshold       -decels: variables       -TOCO: no ctx/irritability -UA: straw/SG 1.004/mod hgb/rare bacteria, sending urine for culture based on pt symptoms -CBC w/ Diff: H/H 10.1/30.4 -Fern: negative -WetPrep: many WBCs, otherwise WNL -GC/CT collected -US: normal scan, AFI WNL, anterior placenta, CL 3.1cm, no free fluid -CE: 1/posterior/thick with brown discharge on glove upon removal -called and spoke with Dr. Earlene Plateravis @1312 , relayed HPI and results of exam/labs, per Dr. Earlene Plateravis, pt to be discharged home with bleeding precautions -pt discharged to home in stable condition  Orders Placed This Encounter  Procedures   Wet prep, genital    Standing Status:   Standing    Number of Occurrences:   1   Culture, OB Urine    Standing Status:   Standing    Number of Occurrences:   1   US MFM OB LIMITED    Please locate placenta and perform cervical length measurement. Thank you.    Standing Status:   Standing    Number of Occurrences:   1    Order Specific Question:   Symptom/Reason for Exam    Answer:   Vaginal bleeding in pregnancy [[id number]]   Urinalysis, Routine w reflex microscopic    Standing Status:   Standing    Number of Occurrences:   1   CBC with Differential/Platelet    Standing Status:   Standing    Number of Occurrences:   1   POCT fern test    Standing Status:   Standing    Number of Occurrences:   1  Discharge patient    Order Specific Question:   Discharge disposition    Answer:   01-Home or Self Care [1]    Order  Specific Question:   Discharge patient date    Answer:   08/25/2018   Meds ordered this encounter  Medications   Elastic Bandages & Supports (COMFORT FIT MATERNITY SUPP SM) MISC    Sig: 1 Units by Does not apply route daily as needed.    Dispense:  1 each    Refill:  0    Order Specific Question:   Supervising Provider    Answer:   Conan Bowens [[id number]]   Assessment and Plan   1. Abdominal pain during pregnancy in second trimester   2. Vaginal bleeding in pregnancy   3. [redacted] weeks gestation of pregnancy   4. NST (non-stress test) with decelerations   5. Blood type, Rh positive    Allergies as of 08/25/2018      Reactions   Nsaids Other (See Comments)   Patient with one kidney, avoid NSAIDS      Medication List    STOP taking these medications   metoCLOPramide 10 MG tablet Commonly known as: REGLAN     TAKE these medications   Comfort Fit Maternity Supp Sm Misc 1 Units by Does not apply route daily as needed.   cyclobenzaprine 10 MG tablet Commonly known as: FLEXERIL Take 1 tablet (10 mg total) by mouth every 8 (eight) hours as needed for muscle spasms.   levothyroxine 75 MCG tablet Commonly known as: SYNTHROID Take 1 tablet (75 mcg total) by mouth daily.   Magnesium 200 MG Tabs Take 2 tablets (400 mg total) by mouth at bedtime.   promethazine 25 MG tablet Commonly known as: PHENERGAN Take 1 tablet (25 mg total) by mouth every 6 (six) hours as needed for nausea or vomiting.      -RX and instructions for pregnancy belt given with instructions (instructions translated with interpreter) -pelvic rest advised, pt instructed to avoid heavy lifting -strict VB/pain/PTL/return MAU precautions given, pt advised to return to MAU immediately with new or worsening symptoms -pt discharged to home in stable condition  Joni Reining E Anquan Azzarello 08/25/2018, 2:55 PM

## 2018-08-25 NOTE — Discharge Instructions (Signed)
Información sobre parto y trabajo de parto prematuros °(Preterm Labor and Birth Information) °La duración de un embarazo normal es de 39 a 41 semanas. Se llama trabajo de parto prematuro cuando se inicia antes de las 37 semanas de embarazo. °¿CUÁLES SON LOS FACTORES DE RIESGO DEL TRABAJO DE PARTO PREMATURO? °Existen mayores probabilidades de trabajo de parto prematuro en mujeres con las siguientes características: °· Tienen ciertas infecciones durante el embarazo, como infección de vejiga, infección de transmisión sexual o infección en el útero (corioamnionitis). °· Tienen el cuello del útero más corto que lo normal. °· Tuvieron trabajo de parto prematuro anteriormente. °· Se sometieron a una cirugía en el cuello del útero. °· Son menores de 17 años o mayores de 35 años de edad. °· Son afroamericanas. °· Están embarazadas de mellizos o de varios bebés (gestación múltiple). °· Consumen drogas o fuman mientras están embarazadas. °· No aumentan de peso lo suficiente durante el embarazo. °· Se embarazan poco después de haber estado embarazadas. °¿CUÁLES SON LOS SÍNTOMAS DEL TRABAJO DE PARTO PREMATURO? °Los síntomas del trabajo de parto prematuro incluyen lo siguiente: °· Calambres similares a los que ocurren durante el período menstrual. Los calambres pueden presentarse con diarrea. °· Dolor en el abdomen o en la parte inferior de la espalda. °· Contracciones uterinas regulares que se pueden sentir como una presión en el abdomen. °· Una sensación de mayor presión en la pelvis. °· Aumento de la secreción de moco acuoso o sanguinolento en la vagina. °· Rotura de bolsa (rotura de saco amniótico). °¿POR QUÉ ES IMPORTANTE RECONOCER LOS SIGNOS DEL TRABAJO DE PARTO PREMATURO? °Es importante reconocer los signos del trabajo de parto prematuro porque los bebés que nacen de forma prematura pueden no estar completamente desarrollados. Por lo tanto, pueden correr mayor riesgo de lo siguiente: °· Problemas cardíacos y pulmonares a  largo plazo (crónicos). °· Inmediatamente después del parto, dificultades para regular los sistemas corporales, que incluyen glucemia, temperatura corporal, frecuencia cardíaca y frecuencia respiratoria. °· Hemorragia cerebral. °· Parálisis cerebral. °· Dificultades en el aprendizaje. °· Muerte. °Estos riesgos son mucho mayores para bebés que nacen antes de las 34 semanas de embarazo. °¿CÓMO SE TRATA EL TRABAJO DE PARTO PREMATURO? °El tratamiento depende del tiempo de su embarazo, su afección y la salud de su bebé. Puede incluir lo siguiente: °· Tener un punto (sutura) en el cuello del útero para evitar que este se abra demasiado pronto (cerclaje). °· Tomar medicamentos, por ejemplo: °? Medicamentos hormonales. Estos se pueden administrar de forma temprana en el embarazo para ayudar a mantener el embarazo. °? Medicamentos para detener las contracciones. °? Medicamentos que ayudan a madurar los pulmones del bebé. Estos se pueden recetar si el riesgo de parto es alto. °? Medicamentos para evitar que el bebé desarrolle parálisis cerebral. °Si el trabajo de parto de inicia antes de las 34 semanas de embarazo, es posible que deba hospitalizarse. °¿QUÉ DEBO HACER SI CREO QUE ESTOY EN TRABAJO DE PARTO PREMATURO? °Si cree que está iniciando trabajo de parto prematuro, llame al médico de inmediato. °¿CÓMO PUEDO EVITAR EL TRABAJO DE PARTO PREMATURO EN FUTUROS EMBARAZOS? °Para aumentar las probabilidades de tener un embarazo a término, tenga en cuenta lo siguiente: °· No consuma ningún producto que contenga tabaco, lo que incluye cigarrillos, tabaco de mascar y cigarrillos electrónicos. Si necesita ayuda para dejar de fumar, consulte al médico. °· No consuma drogas ni medicamentos que no sean recetados durante el embarazo. °· Hable con el médico antes de tomar suplementos a base de hierbas aunque los haya estado tomando   peridicamente.  Asegrese de llegar a un peso Office managersaludable durante el embarazo.  Tenga cuidado con las  infecciones. Si cree que puede tener una infeccin, consulte al mdico para que la revisen.  Asegrese de informarle al mdico si ha tenido trabajo de parto prematuro antes. Esta informacin no tiene Theme park managercomo fin reemplazar el consejo del mdico. Asegrese de hacerle al mdico cualquier pregunta que tenga. Document Released: 05/13/2007 Document Revised: 10/11/2015 Document Reviewed: 06/27/2015 Elsevier Patient Education  2020 Elsevier Inc.   Dolor abdominal durante el embarazo Abdominal Pain During Pregnancy  El dolor abdominal es comn durante el embarazo y tiene muchas causas posibles. Algunas causas son ms graves que otras, y a Advertising account executiveveces la causa se desconoce. El dolor abdominal puede ser un indicio de que est comenzando el Orangeparto. Tambin puede ser ocasionado por el crecimiento y estiramiento de los msculos y ligamentos durante el Psychiatristembarazo. Siempre informe a su mdico si siente dolor abdominal. Siga estas indicaciones en su casa:  No tenga relaciones sexuales ni se coloque nada dentro de la vagina hasta que el dolor haya desaparecido completamente.  Descanse todo lo que pueda RadioShackhasta que el dolor se le haya calmado.  Beba suficiente lquido para Photographermantener la orina de color amarillo plido.  Tome los medicamentos de venta libre y los recetados solamente como se lo haya indicado el mdico.  OceanographerConcurra a todas las visitas de control como se lo haya indicado el mdico. Esto es importante. Comunquese con un mdico si:  El dolor contina o empeora despus de Lawyerdescansar.  Siente dolor en la parte inferior del abdomen que: ? Va y viene en intervalos regulares. ? Se extiende a la espalda. ? Es parecido a los Tree surgeonclicos menstruales.  Siente dolor o ardor al Geographical information systems officerorinar. Solicite ayuda de inmediato si:  Tiene fiebre o siente escalofros.  Tiene una hemorragia vaginal abundante.  Tiene una prdida de lquido por la vagina.  Elimina tejidos por la vagina.  Vomita o tiene diarrea durante ms de  24horas.  El beb se mueve menos de lo habitual.  Se siente dbil o se desmaya.  Le falta el aire.  Siente dolor intenso en la parte superior del abdomen. Resumen  El dolor abdominal es comn durante el Hamilton Squareembarazo y tiene muchas causas posibles.  Si siente dolor abdominal durante el embarazo, informe al mdico de inmediato.  Siga las indicaciones del mdico para el cuidado en el hogar y concurra a todas las visitas de control como se lo hayan indicado. Esta informacin no tiene Theme park managercomo fin reemplazar el consejo del mdico. Asegrese de hacerle al mdico cualquier pregunta que tenga. Document Released: 02/03/2005 Document Revised: 07/29/2016 Document Reviewed: 07/29/2016 Elsevier Patient Education  2020 Elsevier Inc.  Hemorragia vaginal durante el segundo trimestre de Psychiatristembarazo  Vaginal Bleeding During Pregnancy, Second Trimester  Durante el embarazo es relativamente comn que se presente un pequeo sangrado (prdidas) proveniente de la vagina. Normalmente esto se detiene por s solo. Hay diversos factores que pueden causar prdidas en Firefighterel embarazo. A veces, la hemorragia es normal y no es signo de un problema en Firefighterel embarazo. Sin embargo, la hemorragia tambin puede ser un signo de algo grave. Debe informar a su mdico de inmediato si tiene alguna hemorragia vaginal. Algunas causas posibles de hemorragia vaginal durante el segundo trimestre incluyen lo siguiente:  Infeccin, inflamacin o crecimientos anmalos (plipos) en el cuello uterino.  Una afeccin por la cual la placenta cubre parcial o completamente la abertura del cuello uterino (placenta previa).  La placenta se  separa del tero (desprendimiento de placenta).  Trabajo de parto temprano (prematuro).  Se abre y se afina el cuello uterino antes que el Big Lotsembarazo llegue a trmino y antes de Games developercomenzar el trabajo de parto ( cuello uterino incompetente).  Una masa de tejido que crece en el tero debido a una mala fertilizacin del  vulo (embarazo molar). Siga estas indicaciones en su casa: Actividad  Siga las indicaciones del mdico respecto de las restricciones en las actividades. Pregunte qu actividades son seguras para usted.  Si es necesario, organcese para que alguien la ayude con las actividades cotidianas.  No haga ejercicios ni actividades que requieran mucho esfuerzo hasta que su mdico lo autorice.  No levante ningn objeto que pese ms de 10libras (4,5kg) o el lmite de peso que le indique su mdico hasta que l le diga que puede Quantico Basehacerlo.  No tenga relaciones sexuales ni orgasmos hasta que su mdico le diga que es seguro. Medicamentos  Baxter Internationalome los medicamentos de venta libre y los recetados solamente como se lo haya indicado el mdico.  No tome aspirina, ya que puede causar hemorragias. Instrucciones generales  Est atenta a cualquier cambio en los sntomas.  Lleve un registro de la cantidad de apsitos higinicos que [place]ausa por da, la frecuencia con la que los cambia y cun empapados (saturados) estn.  No use tampones ni se haga duchas vaginales.  Si elimina tejido por la vagina, gurdelo para mostrrselo al American Expressmdico.  Concurra a todas las visitas de control como se lo haya indicado el mdico. Esto es importante. Comunquese con un mdico si:  Tiene un sangrado vaginal en cualquier momento del embarazo.  Tiene calambres o dolores de Wellfordparto.  Tiene fiebre que no baja cuando toma medicamentos. Solicite ayuda de inmediato si:  Tiene clicos intensos en la espalda o el abdomen.  Siente contracciones.  Tiene escalofros.  Elimina cogulos grandes o gran cantidad de tejido por la vagina.  La hemorragia aumenta.  Se siente mareada, dbil, o se desmaya.  Tiene una prdida importante o sale lquido a borbotones por la vagina. Resumen  Hay diversos factores que pueden causar hemorragia o prdidas en el embarazo.  Debe informar a su mdico de inmediato si tiene alguna hemorragia  vaginal.  Siga las indicaciones del mdico respecto de las restricciones en las Hughestownactividades. Pregunte qu actividades son seguras para usted. Esta informacin no tiene Theme park managercomo fin reemplazar el consejo del mdico. Asegrese de hacerle al mdico cualquier pregunta que tenga. Document Released: 11/13/2004 Document Revised: 10/30/2016 Document Reviewed: 10/30/2016 Elsevier Patient Education  2020 Elsevier Inc.   PREGNANCY SUPPORT BELT: You are not alone, Seventy-five percent of women have some sort of abdominal or back pain at some point in their pregnancy. Your baby is growing at a fast pace, which means that your whole body is rapidly trying to adjust to the changes. As your uterus grows, your back may start feeling a bit under stress and this can result in back or abdominal pain that can go from mild, and therefore bearable, to severe pains that will not allow you to sit or lay down comfortably, When it comes to dealing with pregnancy-related pains and cramps, some pregnant women usually prefer natural remedies, which the market is filled with nowadays. For example, wearing a pregnancy support belt can help ease and lessen your discomfort and pain. WHAT ARE THE BENEFITS OF WEARING A PREGNANCY SUPPORT BELT? A pregnancy support belt provides support to the lower portion of the belly taking some of the weight  of the growing uterus and distributing to the other parts of your body. It is designed make you comfortable and gives you extra support. Over the years, the pregnancy apparel market has been studying the needs and wants of pregnant women and they have come up with the most comfortable pregnancy support belts that woman could ever ask for. In fact, you will no longer have to wear a stretched-out or bulky pregnancy belt that is visible underneath your clothes and makes you feel even more uncomfortable. Nowadays, a pregnancy support belt is made of comfortable and stretchy materials that will not irritate  your skin but will actually make you feel at ease and you will not even notice you are wearing it. They are easy to put on and adjust during the day and can be worn at night for additional support.  BENEFITS:  Relives Back pain  Relieves Abdominal Muscle and Leg Pain  Stabilizes the Pelvic Ring  Offers a Cushioned Abdominal Lift Pad  Relieves pressure on the Sciatic Nerve Within Minutes WHERE TO GET YOUR PREGNANCY BELT: [employer] [phone] @[phone], [place] [address]

## 2018-08-26 LAB — CULTURE, OB URINE

## 2018-08-27 LAB — GC/CHLAMYDIA PROBE AMP (~~LOC~~) NOT AT ARMC
Chlamydia: NEGATIVE
Neisseria Gonorrhea: NEGATIVE

## 2018-09-02 ENCOUNTER — Encounter: Payer: Self-pay | Admitting: General Practice

## 2018-09-02 ENCOUNTER — Other Ambulatory Visit: Payer: Self-pay

## 2018-09-02 ENCOUNTER — Ambulatory Visit (INDEPENDENT_AMBULATORY_CARE_PROVIDER_SITE_OTHER): Payer: Self-pay | Admitting: Women's Health

## 2018-09-02 DIAGNOSIS — O26892 Other specified pregnancy related conditions, second trimester: Secondary | ICD-10-CM

## 2018-09-02 DIAGNOSIS — R51 Headache: Secondary | ICD-10-CM

## 2018-09-02 DIAGNOSIS — Z148 Genetic carrier of other disease: Secondary | ICD-10-CM | POA: Insufficient documentation

## 2018-09-02 DIAGNOSIS — Z3A24 24 weeks gestation of pregnancy: Secondary | ICD-10-CM

## 2018-09-02 DIAGNOSIS — R519 Headache, unspecified: Secondary | ICD-10-CM | POA: Insufficient documentation

## 2018-09-02 DIAGNOSIS — Z3480 Encounter for supervision of other normal pregnancy, unspecified trimester: Secondary | ICD-10-CM

## 2018-09-02 NOTE — Progress Notes (Signed)
TELEHEALTH VIRTUAL OBSTETRICS VISIT ENCOUNTER NOTE  I connected with Joanne [name] on 09/02/18 at 10:30 AM EDT by WebEx at home and verified that I am speaking with the correct person using two identifiers.   I discussed the limitations, risks, security and privacy concerns of performing an evaluation and management service by WebEx and the availability of in person appointments. I also discussed with the patient that there may be a patient responsible charge related to this service. The patient expressed understanding and agreed to proceed.  Subjective:  Joanne [name] is a 38 y.o. G3P2002 at 75w4dbeing followed for ongoing prenatal care.  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   Patient reports no complaints and that the pregnancy belt is helping with her pelvic pressure (was prescribed this after visit to MAU) Reports fetal movement. Denies any contractions, bleeding or leaking of fluid.   The following portions of the patient's history were reviewed and updated as appropriate: allergies, current medications, past family history, past medical history, past social history, past surgical history and problem list.   Objective:   General:  Alert, oriented and cooperative.   Mental Status: Normal mood and affect perceived. Normal judgment and thought content.  Rest of physical exam deferred due to type of encounter  BP 99/63   Pulse 72   LMP 03/14/2018 (Approximate)  ** BP taken by patient's at-home BP cuff**  Assessment and Plan:  Pregnancy: G3P2002 at 239w4d1. Supervision of other normal pregnancy, antepartum -pt taking 75104mSynthroid daily, will check thyroid labs '@28wk'  visit -pt and partner desire  vasectomy for female partner for contraception, pt advised to have partner schedule consultation visit for vasectomy soon  2. 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 Headache in pregnancy, antepartum, second trimester -pt reports HAs are getting better, did not fill RX for Flexeril, Tylenol working for her HA   Preterm labor symptoms and general obstetric precautions including but not limited to vaginal bleeding, contractions, leaking of fluid and fetal movement were reviewed in detail with the patient.   I discussed the assessment and treatment plan with the patient. The patient was provided an opportunity to ask questions and all were answered. The patient agreed with the plan and demonstrated an understanding of the instructions. The patient was advised to call back or seek an in-person office evaluation/[facility] urgent or concerning symptoms. Please refer to After Visit Summary for other counseling recommendations.   I provided 15 minutes of non-face-to-face time during this encounter.  Return in about 4 weeks (around 09/30/2018) for in-person (pt aware), for 28week labs + THYROID Panel, referral entered for genetic counseling.  Future Appointments  Date Time Provider DepRaymond/27/2020  9:00 AM [facility]  09/30/2018  8:10 AM DawLaury DeepNM [facility]gent, NP [facility]

## 2018-09-02 NOTE — Patient Instructions (Addendum)
Informacin sobre parto y [place] de parto prematuros (Preterm Labor and Birth Information) La duracin de un embarazo normal es de 67 a 41semanas. Se llama trabajo de parto prematuro cuando se inicia antes de las 37semanas de [place]. [place]? Existen mayores probabilidades de trabajo de parto prematuro en mujeres con las siguientes caractersticas:  Tienen ciertas infecciones durante el embarazo, como infeccin de vejiga, infeccin de transmisin sexual o infeccin en el tero (corioamnionitis).  Tienen el cuello del tero ms corto que lo normal.  Tuvieron trabajo de parto prematuro anteriormente.  Se sometieron a una ciruga en el cuello del tero.  Son menores de 18aos o mayores de 50aos de edad.  Son afroamericanas.  Estn embarazadas de [employer] o de varios bebs (gestacin mltiple).  Consumen drogas o fuman mientras estn embarazadas.  No aumentan de peso lo suficiente durante el [employer].  Se embarazan poco despus de [employer] CULES SON LOS SNTOMAS DEL [place]? Los sntomas del trabajo de parto prematuro incluyen lo siguiente:  Marketing executive similares a los que ocurren durante el perodo menstrual. Los calambres pueden presentarse con diarrea.  Dolor en el abdomen o en la parte inferior de la espalda.  Contracciones uterinas regulares que se pueden sentir como una presin en el abdomen.  Una sensacin de mayor presin en la pelvis.  Aumento de la secrecin de moco acuoso o sanguinolento en la vagina.  Rotura de bolsa (rotura de saco amnitico). POR QU ES IMPORTANTE RECONOCER LOS SIGNOS DEL [place]? Es Glass blower/designer los signos del trabajo de parto prematuro porque los bebs que nacen de forma prematura pueden no estar completamente desarrollados. Por lo tanto, pueden correr mayor riesgo de lo siguiente:  Problemas cardacos y pulmonares a  Barrister's clerk (crnicos).  Inmediatamente despus del parto, dificultades para regular los sistemas corporales, que incluyen glucemia, temperatura corporal, frecuencia cardaca y frecuencia respiratoria.  Hemorragia cerebral.  Parlisis cerebral.  Dificultades en el aprendizaje.  Muerte. Estos riesgos son [employer] para bebs que nacen antes de las 34semanas de [place]. [place]? El tratamiento depende del tiempo de su [place], su afeccin y la salud de su beb. Puede incluir lo siguiente:  Tener un punto (sutura) en el cuello del tero para evitar que este se abra demasiado pronto (cerclaje).  Tomar medicamentos, por ejemplo: ? Medicamentos hormonales. Estos se pueden administrar de forma temprana en el embarazo para ayudar a Comptroller. ? [place] contracciones. ? Medicamentos que ayudan a [employer] del beb. Estos se pueden recetar si el riesgo de parto es Magazine. ? Medicamentos para evitar que el beb desarrolle parlisis cerebral. Si el trabajo de parto de inicia antes de las 34semanas de [place], es posible que deba hospitalizarse. QU DEBO HACER SI CREO QUE ESTOY EN TRABAJO DE [place]? Si cree que est iniciando trabajo de parto prematuro, llame al mdico de inmediato. [place] DE PARTO PREMATURO EN FUTUROS EMBARAZOS? Para aumentar las probabilidades de tener un embarazo a trmino, Dance movement psychotherapist en cuenta lo siguiente:  No consuma ningn producto que contenga tabaco, lo que incluye cigarrillos, tabaco de Higher education careers adviser y Psychologist, sport and exercise. Si necesita ayuda para dejar de fumar, consulte al mdico.  No consuma drogas ni medicamentos que no sean recetados Solicitor.  Hable con el mdico antes de tomar suplementos a base de hierbas aunque los haya estado tomando  periódicamente. °· Asegúrese de llegar a un peso saludable durante el embarazo. °· Tenga cuidado con las  infecciones. Si cree que puede tener una infección, consulte al médico para que la revisen. °· Asegúrese de informarle al médico si ha tenido trabajo de parto prematuro antes. °Esta información no tiene como fin reemplazar el consejo del médico. Asegúrese de hacerle al médico cualquier pregunta que tenga. °Document Released: 05/13/2007 Document Revised: 10/11/2015 Document Reviewed: 06/27/2015 °Elsevier Patient Education © 2020 Elsevier Inc. ° °

## 2018-09-13 ENCOUNTER — Ambulatory Visit (HOSPITAL_COMMUNITY): Payer: Self-pay | Admitting: Obstetrics and Gynecology

## 2018-09-13 ENCOUNTER — Ambulatory Visit (HOSPITAL_COMMUNITY): Payer: Self-pay | Admitting: Women's Health

## 2018-09-13 ENCOUNTER — Ambulatory Visit (HOSPITAL_COMMUNITY): Payer: Self-pay | Attending: Obstetrics and Gynecology

## 2018-09-13 ENCOUNTER — Other Ambulatory Visit: Payer: Self-pay

## 2018-09-13 NOTE — Progress Notes (Addendum)
Patient in session with Genetic Counselor Control and instrumentation engineer. Interpreter present. Patient will not be having further screening today.

## 2018-09-30 ENCOUNTER — Other Ambulatory Visit: Payer: Self-pay

## 2018-09-30 ENCOUNTER — Ambulatory Visit (INDEPENDENT_AMBULATORY_CARE_PROVIDER_SITE_OTHER): Payer: Self-pay | Admitting: Obstetrics and Gynecology

## 2018-09-30 VITALS — BP 94/60 | HR 90 | Temp 98.0°F | Wt 152.0 lb

## 2018-09-30 DIAGNOSIS — Z3A28 28 weeks gestation of pregnancy: Secondary | ICD-10-CM

## 2018-09-30 DIAGNOSIS — Z3483 Encounter for supervision of other normal pregnancy, third trimester: Secondary | ICD-10-CM

## 2018-09-30 DIAGNOSIS — Z3480 Encounter for supervision of other normal pregnancy, unspecified trimester: Secondary | ICD-10-CM

## 2018-09-30 DIAGNOSIS — Z148 Genetic carrier of other disease: Secondary | ICD-10-CM

## 2018-09-30 MED ORDER — TETANUS-DIPHTH-ACELL PERTUSSIS 5-2.5-18.5 LF-MCG/0.5 IM SUSP: 0.5000 mL | Freq: Once | INTRAMUSCULAR | 0 refills | Status: AC

## 2018-09-30 NOTE — Progress Notes (Signed)
   PRENATAL VISIT NOTE  Subjective:  Joanne [name] is a 38 y.o. G3P2002 at [redacted]w[redacted]d being seen today for ongoing prenatal care.  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  Patient reports Rt leg has pain that radiates down to foot. The pain usually happens at night and starts where the RT leg connects to hip.  Contractions: Not present. Vag. Bleeding: None.  Movement: Present. Denies leaking of fluid.   The following portions of the patient's history were reviewed and updated as appropriate: allergies, current medications, past family history, past medical history, past social history, past surgical history and problem list.   Objective:   Vitals:   09/30/18 0819  BP: 94/60  Pulse: 90  Temp: 98 F (36.7 C)  Weight: 152 lb (68.9 kg)    Fetal Status: Fetal Heart Rate (bpm): 153 Fundal Height: 29 cm Movement: Present     General:  Alert, oriented and cooperative. Patient is in no acute distress.  Skin: Skin is warm and dry. No rash noted.   Cardiovascular: Normal heart rate noted  Respiratory: Normal respiratory effort, no problems with respiration noted  Abdomen: Soft, gravid, appropriate for gestational age.  Pain/Pressure: Present     Pelvic: Cervical exam deferred        Extremities: Normal range of motion.  Edema: None  Mental Status: Normal mood and affect. Normal behavior. Normal judgment and thought content.   Assessment and Plan:  Pregnancy: G3P2002 at [redacted]w[redacted]d 1. Supervision of other normal pregnancy, antepartum - Glucose Tolerance, 2 Hours w/1 Hour - HIV Antibody (routine testing w rflx) - RPR - CBC - Tdap (BOOSTRIX) 5-2.5-18.5 LF-MCG/0.5 injection; Inject 0.5 mLs into the muscle once for 1 dose.   Dispense: 0.5 mL; Refill: 0 - Information provided on Fe rich diet, FKC and Sciatic nerve pain  - Anticipate nv in 4 via Webex  Preterm labor symptoms and general obstetric precautions including but not limited to vaginal bleeding, contractions, leaking of fluid and fetal movement were reviewed in detail with the patient. Please refer to After Visit Summary for other counseling recommendations.   Return in about 4 weeks (around 10/28/2018) for Return OB - webex/telehealth.  Future Appointments  Date Time Provider [place]  10/28/2018  8:50 AM Laury Deep, CNM [facility] None    Laury Deep, [place]

## 2018-09-30 NOTE — Patient Instructions (Signed)
Citica Sciatica  La citica es Conservation officer, historic buildings, entumecimiento, debilidad u hormigueo a lo largo del nervio citico. El nervio citico comienza en la parte inferior de la espalda y desciende por la parte posterior de cada pierna. Controla los msculos en la parte inferior de las piernas y en la parte posterior de las rodillas. Tambin proporciona sensibilidad a la parte posterior de los muslos, la parte inferior de las piernas y la planta de los pies. La citica es un sntoma de otra afeccin que ejerce presin o "pellizca" el nervio citico. Con mayor frecuencia, la citica afecta a un solo lado del cuerpo. Suele desaparecer por s sola o con tratamiento. En algunos casos, la Geographical information systems officer (ser recurrente). Cules son las causas? Esta afeccin es causada por una presin sobre el nervio citico o "pellizco" del nervio citico. Esto puede ser el resultado de:  Un disco que sobresale demasiado entre los huesos de la columna vertebral (hernia de disco).  Cambios en los discos vertebrales relacionados con la edad.  Un trastorno doloroso que afecta un msculo de las nalgas.  Un crecimiento seo adicional cerca del nervio citico.  Una rotura (fractura) de la pelvis.  Embarazo.  Tumor. Esto es poco frecuente. Qu incrementa el riesgo? Los siguientes factores pueden hacer que sea ms propenso a Armed forces training and education officer afeccin:  Careers information officer que ponen presin o tensin sobre la columna vertebral.  Tener poca fuerza y flexibilidad.  Antecedentes de ciruga o lesin en la espalda.  Estar sentado durante largos perodos de [place].  Realizar actividades que requieren agacharse o levantar objetos en forma repetida.  Obesidad. Cules son los signos o los sntomas? Los sntomas pueden ser leves o graves, y pueden incluir los siguientes:  Cualquiera de los siguientes problemas en la parte inferior de la espalda, piernas, cadera o nalgas: ? Hormigueo leve, adormecimiento o dolor sordo. ?  Sensacin de ardor. ? Dolor agudo.  Adormecimiento de la parte posterior de la pantorrilla o la planta del pie.  Debilidad en las piernas.  Dolor de espalda intenso que dificulta el movimiento. Los sntomas podran empeorar al toser, Brewing technologist o rerse, o cuando se est sentado o de pie durante perodos prolongados. Cmo se diagnostica? Esta afeccin se puede diagnosticar en funcin de lo siguiente:  Los sntomas y los antecedentes mdicos.  Un examen fsico.  Anlisis de sangre.  Pruebas de diagnstico por imgenes, por ejemplo: ? Radiografas. ? Resonancia magntica (RM). ? Exploracin por tomografa computarizada (TC). Cmo se trata? En muchos casos, esta afeccin mejora por s sola, sin tratamiento. Sin embargo, el tratamiento puede incluir:  Reduccin o modificacin la actividad fsica.  Ejercicios y estiramientos.  Aplicacin de calor o hielo en la zona afectada.  Medicamentos para lo siguiente: ? Aliviar el dolor y la inflamacin. ? Relajar los msculos.  Medicamentos inyectables que ayudan a Best boy, la irritacin y la inflamacin alrededor del nervio citico (corticoesteroides).  Ciruga. Siga estas instrucciones en su casa: Medicamentos  Tome los medicamentos de venta libre y los recetados solamente como se lo haya indicado el mdico.  Pregntele al mdico si el medicamento recetado: ? Hace que sea necesario que evite conducir o usar maquinaria pesada. ? Puede causarle estreimiento. Es posible que tenga que tomar estas medidas para prevenir o tratar el estreimiento:  Electronics engineer suficiente lquido como para Theatre manager la orina de color amarillo plido.  Tomar medicamentos recetados o de [employer].  Consumir alimentos ricos en fibra, como frijoles, cereales integrales, y frutas y verduras frescas.  Limitar el consumo de alimentos ricos en grasa y azcares procesados, como los alimentos fritos o dulces. Control del dolor      Si se lo  indican, aplique hielo sobre la zona afectada. ? Ponga el hielo en una bolsa plstica. ? Coloque una FirstEnergy Corptoalla entre la piel y Copyla bolsa. ? Coloque el hielo durante 20minutos, 2 a 3veces por da.  Si se lo indican, aplique calor en la zona afectada. Use la fuente de calor que el mdico le recomiende, como una compresa de calor hmedo o una almohadilla trmica. ? Coloque una FirstEnergy Corptoalla entre la piel y la fuente de Airline pilotcalor. ? Aplique calor durante 20 a 30minutos. ? Retire la fuente de calor si la piel se pone de color rojo brillante. Esto es especialmente importante si no puede sentir dolor, calor o fro. Puede correr un riesgo mayor de sufrir quemaduras. Actividad   Retome sus actividades normales segn lo indicado por el mdico. Pregntele al mdico qu actividades son seguras para usted.  Evite las Liberty Mutualactividades que empeoran los sntomas.  Durante el da, descanse durante lapsos breves. ? Cuando descanse durante perodos ms largos, incorpore alguna Zimbabweactividad suave o ejercicios de Bank of Americaelongacin entre los perodos de descanso. Esto ayudar a Transport plannerevitar la rigidez y Chief Technology Officerel dolor. ? Evite estar sentado durante largos perodos de tiempo sin moverse. Levntese y Killonamuvase al menos una vez cada hora.  Haga ejercicio y elongue habitualmente, como se lo haya indicado el mdico.  No levante nada que pese ms de 10libras (4.5kg) mientras tenga sntomas de citica. Aunque no tenga sntomas, evite levantar objetos pesados, en especial en forma repetida.  Siempre use las tcnicas de levantamiento correctas para levantar objetos, entre ellas: ? Flexionar las rodillas. ? Mantener la carga cerca del cuerpo. ? No torcerse. Instrucciones generales  Mantenga un peso saludable. El exceso de peso ejerce tensin adicional sobre la espalda.  Use calzado con buen apoyo y cmodo. Evite usar tacones.  Evite dormir sobre un colchn que sea demasiado blando o demasiado duro. Un colchn que ofrezca un apoyo suficientemente firme  para su espalda al dormir puede ayudar a Engineer, materialsaliviar el dolor.  Concurra a todas las visitas de [address]seguimiento como se lo haya indicado el mdico. Esto es importante. Comunquese con un mdico si:  Tiene un dolor con estas caractersticas: ? Lo despierta cuando est dormido. ? Empeora al estar recostado. ? Es Teacher, musicpeor del que experiment en el pasado. ? Dura ms de 4semanas.  Pierde peso de Tiptonmanera inexplicable. Solicite ayuda inmediatamente si:  No puede controlar cundo Automotive engineerorinar o defecar (incontinencia).  Tiene lo siguiente: ? Debilidad que empeora en la parte inferior de la espalda, la pelvis, las nalgas o las piernas. ? Enrojecimiento o inflamacin en la espalda. ? Sensacin de ardor al ConocoPhillipsorinar. Resumen  La citica es Chief Technology Officerel dolor, entumecimiento, debilidad u hormigueo a lo largo del nervio citico.  Esta afeccin es causada por una presin sobre el nervio citico o "pellizco" del nervio citico.  La citica puede Programmer, multimediacausar dolor, adormecimiento u hormigueo en la parte inferior de la espalda, las piernas, las caderas y las nalgas.  El tratamiento a menudo incluye reposo, ejercicios, medicamentos y Engineer, manufacturing systemsaplicar hielo o calor. Esta informacin no tiene Theme park managercomo fin reemplazar el consejo del mdico. Asegrese de hacerle al mdico cualquier pregunta que tenga. Document Released: 02/03/2005 Document Revised: 03/31/2018 Document Reviewed: 03/31/2018 Elsevier Patient Education  2020 [employer]vier Inc. Evaluacin de los movimientos fetales Fetal Movement Counts Introduccin Nombre del paciente: ________________________________________________ Joanne ChapmanFecha de parto estimada: ____________________  Qu es una evaluacin de los movimientos fetales?  Una evaluacin de los movimientos fetales es el registro del nmero de veces que siente que el beb se mueve durante un cierto perodo de [place]. Esto tambin se puede denominar recuento de patadas fetales. Una evaluacin de movimientos fetales se recomienda a todas las  embarazadas. Es posible que le indiquen que comience a Chief of Staff los movimientos fetales desde la semana 28 de [place]. Preste atencin cuando sienta que el beb est ms activo. Podr detectar los ciclos en que el beb duerme y est despierto. Tambin podr detectar que ciertas cosas hacen que su beb se mueva ms. Deber realizar una evaluacin de los movimientos fetales en las siguientes situaciones:  Cuando el beb est ms activo habitualmente.  A la [employer], todos los [place]. Un buen momento para evaluar los movimientos fetales es cuando est descansando, despus de haber comido y bebido algo. Cmo debo contar los movimientos fetales? 1. Encuentre un lugar tranquilo y cmodo. Sintese o acustese de lado. 2. Anote la fecha, la hora de inicio y de finalizacin y la cantidad de movimientos que sinti entre esas dos horas. Lleve esta informacin a las visitas de control. 3. Cuente las pataditas, revoloteos, chasquidos, vueltas o pinchazos en un perodo de 2horas. Debe sentir al menos 62movimientos en 2horas. 4. Cuando sienta 59movimientos, puede dejar de contar. 5. Si no siente 57movimientos en 2horas, coma y beba algo. Luego, contine descansando y Financial risk analyst. Si siente al menos 34movimientos durante esa hora, puede dejar de contar. Comunquese con un mdico si:  Siente menos de 41movimientos en 2horas.  El beb no se mueve tanto como suele hacerlo. Fecha: ____________ Eda Paschal inicio: ____________ Eda Paschal finalizacin: ____________ Movimientos: ____________ Toma Copier: ____________ Eda Paschal inicio: ____________ Eda Paschal finalizacin: ____________ Movimientos: ____________ Toma Copier: ____________ Eda Paschal inicio: ____________ Eda Paschal finalizacin: ____________ Movimientos: ____________ Toma Copier: ____________ Eda Paschal inicio: ____________ Eda Paschal finalizacin: ____________ Movimientos: ____________ Toma Copier: ____________ Eda Paschal inicio: ____________ Mellody Drown de finalizacin: ____________  Movimientos: ____________ Toma Copier: ____________ Eda Paschal inicio: ____________ Mellody Drown de finalizacin: ____________ Movimientos: ____________ Toma Copier: ____________ Eda Paschal inicio: ____________ Mellody Drown de finalizacin: ____________ Movimientos: ____________ Toma Copier: ____________ Eda Paschal inicio: ____________ Eda Paschal finalizacin: ____________ Movimientos: ____________ Toma Copier: ____________ Eda Paschal inicio: ____________ Mellody Drown de finalizacin: ____________ Movimientos: ____________ Esta informacin no tiene como fin reemplazar el consejo del mdico. Asegrese de hacerle al mdico cualquier pregunta que tenga. Document Released: 05/13/2007 Document Revised: 05/09/2016 Document Reviewed: 03/15/2015 Elsevier Patient Education  Metamora hierro Iron-Rich Diet  El hierro es un mineral que ayuda al organismo a producir hemoglobina. La hemoglobina es una protena de los glbulos rojos que transporta el oxgeno a los tejidos del cuerpo. Consumir muy poco hierro [employer] se sienta dbil y [place], y aumentar su riesgo de contraer infecciones. El hierro es un componente natural de muchos alimentos y muchos otros alimentos tienen hierro agregado (alimentos fortificados con hierro). Es posible que deba seguir una dieta con alto contenido de hierro si no tiene suficiente hierro en el cuerpo debido a Actuary. La cantidad de potasio que necesita diariamente depende de su edad, su sexo y las afecciones que pueda [place]. Siga las indicaciones de su mdico o un especialista en alimentacin y nutricin (nutricionista) sobre la cantidad de hierro que debera consumir Armed forces operational officer. Cules son algunos consejos para seguir este plan? Leer las etiquetas de los alimentos  Lea las etiquetas de los alimentos para saber la cantidad  de miligramos (mg) de hierro que hay en cada porcin. Al cocinar  Cocine los alimentos en ollas de hierro.  Tome estas medidas para que el cuerpo pueda absorber  el hierro de ciertos alimentos con ms facilidad: ? Antes de cocinarlos, remoje los frijoles durante la noche. ? Remoje los cereales integrales durante la noche y culelos antes de usarlos para cocinar. ? Prepare un fermento con las harinas antes del horneado, por ejemplo, usando levadura en la masa del pan. Planificacin de las comidas  Cuando coma alimentos que contengan hierro, debe comerlos con alimentos ricos en vitamina C. Entre ellos, se incluyen las Amerynaranjas, los pimientos, los tomates, las papas y Social workerel mango. La vitamina C ayuda al organismo a Set designerabsorber el hierro. Informacin general  Tome los suplementos de hierro solamente como se lo haya indicado el mdico. La sobredosis de hierro puede ser potencialmente mortal. Si le recetan suplementos de hierro, tmelos con jugo de naranja o un suplemento de vitaminaC.  Cuando coma alimentos fortificados con hierro o tome un suplemento de hierro, tambin debe consumir alimentos que contengan hierro naturalmente, como carne, aves y pescado. Consumir alimentos ricos en hierro naturalmente ayuda al organismo a absorber el hierro que se aade a otros alimentos o que contiene un suplemento.  Ciertos alimentos y bebidas impiden que el cuerpo absorba el hierro adecuadamente. No consuma estos alimentos en la misma comida que aquellos con alto contenido de hierro o con suplementos de Company secretaryhierro. Estos alimentos incluyen: ? Caf, t negro y vino tinto. ? Leche, productos lcteos y alimentos con alto contenido de calcio. ? Porotos y soja. ? Cereales integrales. Qu tipos de alimentos debo consumir? Frutas Ciruelas pasas. Pasas de uva. Consuma frutas con alto contenido de vitamina C, por ejemplo, naranjas, pomelos y fresas, junto con alimentos ricos en hierro. Verduras Espinaca (cocida). Guisantes. Brcoli. Verduras fermentadas. Consuma verduras ricas en vitamina C, como las verduras de Coramhoja, las papas, los morrones y los tomates, junto con los alimentos ricos  en hierro. Cereales Cereales para el desayuno fortificados con hierro. Pan de trigo integral fortificado con hierro. Arroz enriquecido. Granos germinados. Carnes y otras protenas Hgado de res. Ostras. Carne de vaca. Camarones. Pavo. Pollo. Atn. Sardinas. Garbanzos. Frutos secos. Tofu. Semillas de calabaza. Bebidas Jugo de tomate. Jugo de naranja recin exprimido. Jugo de ciruelas. T de hibisco. Batidos instantneos fortificados para el desayuno. Dulces y Statisticianpostres Melaza negra. Alios y condimentos Tahini. Salsa de soja fermentada. Otros alimentos Germen de trigo. Es posible que los productos mencionados arriba no formen una lista completa de las bebidas o los alimentos recomendados. Comunquese con un nutricionista para obtener ms informacin. Qu alimentos debo evitar? Cereales Cereales integrales. Cereal de salvado. Harina de salvado. Avena. Carnes y [address] Roadotras protenas Soja. Productos elaborados a base de protena de la soja. Frijoles negros. Lentejas. Frijoles mungo. Guisantes secos. Lcteos Leche. Crema. Queso. Yogur. Requesn. Bebidas Caf. T negro. Vino tinto. Dulces y ArvinMeritorpostres [place]acao. Chocolate. Helados. Otros alimentos [place]. Organo. Grandes cantidades de perejil. Es posible que los productos que se enumeran ms arriba no sean una lista completa de los alimentos y las bebidas que se Theatre stage managerdeben evitar. Comunquese con un nutricionista para obtener ms informacin. Resumen  El hierro es un mineral que ayuda al organismo a producir hemoglobina. La hemoglobina es una protena de los glbulos rojos que transporta el oxgeno a los tejidos del cuerpo.  El hierro es un componente natural de muchos alimentos y muchos otros alimentos tienen hierro agregado (alimentos fortificados con hierro).  Cuando  coma alimentos que contengan hierro, debe comerlos con alimentos ricos en vitamina C. La vitamina C ayuda al organismo a Set designerabsorber el hierro.  Ciertos alimentos y bebidas impiden que el  cuerpo absorba el hierro [address]adecuadamente, como los cereales integrales y los productos lcteos. Debe evitar consumir estos alimentos en la misma comida que aquellos con alto contenido de hierro o con suplementos de hierro. Esta informacin no tiene Theme park managercomo fin reemplazar el consejo del mdico. Asegrese de hacerle al mdico cualquier pregunta que tenga. Document Released: 11/20/2005 Document Revised: 04/14/2017 Document Reviewed: 04/14/2017 Elsevier Patient Education  2020 Arvin[employer]itorElsevier Inc.

## 2018-10-01 LAB — CBC
Hematocrit: 28.5 % — ABNORMAL LOW (ref 34.0–46.6)
Hemoglobin: 9.5 g/dL — ABNORMAL LOW (ref 11.1–15.9)
MCH: 27.8 pg (ref 26.6–33.0)
MCHC: 33.3 g/dL (ref 31.5–35.7)
MCV: 83 fL (ref 79–97)
Platelets: 280 10*3/uL (ref 150–450)
RBC: 3.42 x10E6/uL — ABNORMAL LOW (ref 3.77–5.28)
RDW: 13.6 % (ref 11.7–15.4)
WBC: 7.2 10*3/uL (ref 3.4–10.8)

## 2018-10-01 LAB — RPR: RPR Ser Ql: NONREACTIVE

## 2018-10-01 LAB — GLUCOSE TOLERANCE, 2 HOURS W/ 1HR
Glucose, 1 hour: 181 mg/dL — ABNORMAL HIGH (ref 65–179)
Glucose, 2 hour: 143 mg/dL (ref 65–152)
Glucose, Fasting: 88 mg/dL (ref 65–91)

## 2018-10-01 LAB — HIV ANTIBODY (ROUTINE TESTING W REFLEX): HIV Screen 4th Generation wRfx: NONREACTIVE

## 2018-10-02 ENCOUNTER — Encounter: Payer: Self-pay | Admitting: Obstetrics and Gynecology

## 2018-10-02 ENCOUNTER — Other Ambulatory Visit: Payer: Self-pay | Admitting: Obstetrics and Gynecology

## 2018-10-02 DIAGNOSIS — O24410 Gestational diabetes mellitus in pregnancy, diet controlled: Secondary | ICD-10-CM | POA: Insufficient documentation

## 2018-10-02 DIAGNOSIS — O99013 Anemia complicating pregnancy, third trimester: Secondary | ICD-10-CM | POA: Insufficient documentation

## 2018-10-02 MED ORDER — FERROUS SULFATE 325 (65 FE) MG PO TBEC: 325.0000 mg | DELAYED_RELEASE_TABLET | Freq: Two times a day (BID) | ORAL | 2 refills | Status: DC

## 2018-10-02 MED ORDER — FERROUS SULFATE 325 (65 FE) MG PO TBEC: 325.0000 mg | DELAYED_RELEASE_TABLET | Freq: Two times a day (BID) | ORAL | 4 refills | Status: DC

## 2018-10-02 NOTE — Progress Notes (Signed)
Orders placed for diabetic education, nutrition and Rx sent for iron deficiency.  Laury Deep, CNM

## 2018-10-04 ENCOUNTER — Telehealth: Payer: Self-pay | Admitting: General Practice

## 2018-10-04 ENCOUNTER — Other Ambulatory Visit: Payer: Self-pay | Admitting: *Deleted

## 2018-10-04 DIAGNOSIS — O24410 Gestational diabetes mellitus in pregnancy, diet controlled: Secondary | ICD-10-CM

## 2018-10-04 NOTE — Telephone Encounter (Signed)
Pt aware of appointment with Diabetes Educator on 10/13/2018 at 2:00pm and verbalized understanding.  Interpreter used for this call.

## 2018-10-13 ENCOUNTER — Other Ambulatory Visit: Payer: Self-pay

## 2018-10-13 ENCOUNTER — Encounter: Payer: Self-pay | Attending: Family Medicine | Admitting: *Deleted

## 2018-10-13 ENCOUNTER — Ambulatory Visit: Payer: Self-pay | Admitting: *Deleted

## 2018-10-13 DIAGNOSIS — Z713 Dietary counseling and surveillance: Secondary | ICD-10-CM | POA: Insufficient documentation

## 2018-10-13 DIAGNOSIS — O24419 Gestational diabetes mellitus in pregnancy, unspecified control: Secondary | ICD-10-CM | POA: Insufficient documentation

## 2018-10-13 NOTE — Progress Notes (Signed)
  Patient was seen on 10/13/2018 for Gestational Diabetes self-management. Patient speaks Spanish, we had live interpretor for this visit. EDD 12/20/2018. Patient states no history of GDM. Diet history obtained. Patient eats good variety of all food groups. Beverages include water and occasionally juice.  The following learning objectives were met by the patient :   States the definition of Gestational Diabetes  States why dietary management is important in controlling blood glucose  Describes the effects of carbohydrates on blood glucose levels  Demonstrates ability to create a balanced meal plan  Demonstrates carbohydrate counting   States when to check blood glucose levels  Demonstrates proper blood glucose monitoring techniques  States the effect of stress and exercise on blood glucose levels  States the importance of limiting caffeine and abstaining from alcohol and smoking  Plan:  Aim for 3 Carb Choices per meal (45 grams) +/- 1 either way  Aim for 1-2 Carbs per snack Begin reading food labels for Total Carbohydrate of foods If OK with your MD, consider  increasing your activity level by walking, Arm Chair Exercises or other activity daily as tolerated Begin checking BG before breakfast and 2 hours after first bite of breakfast, lunch and dinner as directed by MD  Bring Log Book/Sheet and meter to every medical appointment OR use Baby Scripts (see below) Baby Scripts:  Patient not appropriate for [employer] due to language barrier  Take medication if directed by MD  Blood glucose monitor given: Prodigy Lot # [id number] Blood glucose reading: 125 mg/dl @ 1 hour after her lunch  Patient instructed to monitor glucose levels: FBS: 60 - 95 mg/dl 2 hour: <120 mg/dl  Patient received the following handouts:in Spanish  Nutrition Diabetes and Pregnancy  Carbohydrate Counting List  BG Log Sheet  Patient will be seen for follow-up as needed.

## 2018-10-14 ENCOUNTER — Other Ambulatory Visit (INDEPENDENT_AMBULATORY_CARE_PROVIDER_SITE_OTHER): Payer: Self-pay | Admitting: Primary Care

## 2018-10-14 DIAGNOSIS — E038 Other specified hypothyroidism: Secondary | ICD-10-CM

## 2018-10-15 ENCOUNTER — Telehealth (INDEPENDENT_AMBULATORY_CARE_PROVIDER_SITE_OTHER): Payer: Self-pay

## 2018-10-15 LAB — TSH+FREE T4
Free T4: 0.97 ng/dL (ref 0.82–1.77)
TSH: 3 u[IU]/mL (ref 0.450–4.500)

## 2018-10-15 NOTE — Telephone Encounter (Signed)
-----   Message from Kerin Perna, NP sent at 10/15/2018  9:29 AM EDT ----- Continue  levothyroxine 75  Mcg. Thyroid level are normal

## 2018-10-15 NOTE — Telephone Encounter (Signed)
Call placed using pacific interpreter [phone]) patient aware that thyroid levels are normal. Continue levothyroxine 75 mcg. Patient expressed understanding. Nat Christen, CMA

## 2018-10-28 ENCOUNTER — Other Ambulatory Visit: Payer: Self-pay

## 2018-10-28 ENCOUNTER — Ambulatory Visit (INDEPENDENT_AMBULATORY_CARE_PROVIDER_SITE_OTHER): Payer: Self-pay | Admitting: Obstetrics and Gynecology

## 2018-10-28 VITALS — BP 94/59 | HR 81

## 2018-10-28 DIAGNOSIS — O99343 Other mental disorders complicating pregnancy, third trimester: Secondary | ICD-10-CM

## 2018-10-28 DIAGNOSIS — O24410 Gestational diabetes mellitus in pregnancy, diet controlled: Secondary | ICD-10-CM

## 2018-10-28 DIAGNOSIS — O09893 Supervision of other high risk pregnancies, third trimester: Secondary | ICD-10-CM

## 2018-10-28 DIAGNOSIS — F32A Depression, unspecified: Secondary | ICD-10-CM

## 2018-10-28 DIAGNOSIS — E1165 Type 2 diabetes mellitus with hyperglycemia: Secondary | ICD-10-CM

## 2018-10-28 DIAGNOSIS — O99013 Anemia complicating pregnancy, third trimester: Secondary | ICD-10-CM

## 2018-10-28 DIAGNOSIS — Z3A32 32 weeks gestation of pregnancy: Secondary | ICD-10-CM

## 2018-10-28 DIAGNOSIS — F329 Major depressive disorder, single episode, unspecified: Secondary | ICD-10-CM

## 2018-10-28 MED ORDER — METFORMIN HCL ER 500 MG PO TB24: 500.0000 mg | ORAL_TABLET | Freq: Every day | ORAL | 1 refills | Status: DC

## 2018-10-28 NOTE — Progress Notes (Signed)
   MY CHART VIDEO VIRTUAL OBSTETRICS VISIT ENCOUNTER NOTE  I connected with [name] on 10/29/18 at  2:50 PM EDT by My Chart video at home and verified that I am speaking with the correct person using two identifiers.   I discussed the limitations, risks, security and privacy concerns of performing an evaluation and management service by My Chart video and the availability of in person appointments. I also discussed with the patient that there may be a patient responsible charge related to this service. The patient expressed understanding and agreed to proceed.  Subjective:  Joanne [name] is a 38 y.o. G3P2002 at [redacted]w[redacted]d being followed for ongoing prenatal care.  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  Patient reports feeling depressed and easily irritated. Reports fetal movement. Denies any contractions, bleeding or leaking of fluid.   The following portions of the patient's history were reviewed and updated as appropriate: allergies, current medications, past family history, past medical history, past social history, past surgical history and problem list.   Objective:   General:  Alert, oriented and cooperative.   Mental Status: Normal mood and affect perceived. Normal judgment and thought content.  Rest of physical exam deferred due to type of encounter  BP (!) 94/59   Pulse 81   LMP 03/14/2018 (Exact Date)  **Done by patient's own at home BP cuff  Assessment and Plan:  Pregnancy: G3P2002 at [redacted]w[redacted]d  1. Supervision of other high risk pregnancies, third trimester  - Reassurance given that hip pain could be a result  of sciatica  2. Anemia affecting pregnancy in third trimester - Continue taking iron supplements  3. Poorly controlled diabetes mellitus ([place])  - [place] MFM OB FOLLOW UP,  - Rx for MetFORMIN (GLUCOPHAGE-XR) 500 MG 24 hr tablet - Review of CBG log: FBS range = 96-116 2 hr PP range (for B/L/D) = 100-171  > 75% abnormal CBG values - Explained the risks of having BS that are abnormal - Explained will need to transfer one of our sister offices that do HROB  4. Depression affecting pregnancy  - Amb ref to [place]   Preterm labor symptoms and general obstetric precautions including but not limited to vaginal bleeding, contractions, leaking of fluid and fetal movement were reviewed in detail with the patient.  I discussed the assessment and treatment plan with the patient. The patient was provided an opportunity to ask questions and all were answered. The patient agreed with the plan and demonstrated an understanding of the instructions. The patient was advised to call back or seek an in-person office evaluation/go to [facility] at [facility] for any urgent or concerning symptoms. Please refer to After Visit Summary for other counseling recommendations.   I provided 10 minutes of non-face-to-face time during this encounter. There was 5 minutes of chart review time spent prior to this encounter. Total time spent = 15 minutes.  No follow-ups on file.  Future Appointments  Date Time Provider [place]  11/03/2018  3:45 PM [place]  11/25/2018  2:30 PM Constant, Vickii Chafe, MD [place] None    Laury Deep, [place] for [employer], [place]

## 2018-10-28 NOTE — Patient Instructions (Signed)
Diabetes mellitus gestacional, diagnstico Gestational Diabetes Mellitus, Diagnosis La diabetes gestacional (diabetes mellitus gestacional) es una forma de diabetes a corto plazo (temporal) que puede presentarse durante Water quality scientist. Este cuadro desaparece despus del parto. Puede deberse a uno de [employer] o a ambos:  El pncreas no produce suficiente cantidad de una hormona llamada insulina.  El cuerpo no responde de forma normal a la insulina que produce. La insulina permite que los ciertos azcares (glucosa) ingresen a las clulas del cuerpo. Esto le proporciona energa. Si tiene diabetes, los azcares no pueden ingresar a las clulas. Esto produce un aumento del nivel de Dispensing optician (hiperglucemia). Si tiene diabetes gestacional:  Es ms probable que vuelva a tenerla si queda embarazada nuevamente.  Es ms probable que desarrolle diabetes tipo2 en el futuro. Si la diabetes gestacional se trata, es posible que ni usted ni el beb se vean afectados. El mdico fijar los objetivos del tratamiento para usted. En general, los resultados de los niveles de azcar en la sangre deben ser los siguientes:  Despus de no comer durante mucho tiempo (ayunar): 95mg /dl (5,71mmol/l).  Despus de las comidas (posprandial): ? Una hora despus de una comida: igual o menor que 140mg /dl (7,47mmol/l). ? Dos horas despus de una comida: igual o menor que 120mg /dl (6,66mmol/l).  Nivel de A1c (hemoglobinaA1c): del 6% al 6,5%. Siga estas indicaciones en su casa: Preguntas para hacerle al mdico   Puede hacer las siguientes preguntas: ? Es necesario que consulte a Radio broadcast assistant en el cuidado de la diabetes? ? Qu equipos necesitar para cuidarme en casa? ? Qu medicamentos necesito? Cundo debo tomarlos? ? Con qu frecuencia debo controlar mi nivel de azcar en la sangre? ? A qu nmero puedo llamar si tengo preguntas? ? Cundo es mi prxima cita con el mdico? Instrucciones  generales  [employer] de venta libre y los recetados solamente como se lo haya indicado el mdico.  Mantenga un peso saludable durante el [place].  Concurra a todas las visitas de seguimiento como se lo haya indicado el mdico. Esto es importante. Comunquese con un mdico si:  Su nivel de azcar en la sangre es igual o mayor que 240mg /dl (13,9mmol/dl).  Su nivel de azcar en la sangre es igual o mayor que 200mg /dl (11,28mmol/l), y tiene cetonas en la orina.  Ha estado enferma o ha tenido fiebre durante ms de 2 das y no [place].  Tiene alguno de estos problemas durante ms de 6horas: ? No puede comer ni beber. ? Siente malestar estomacal (nuseas). ? Vomita. ? Presenta heces lquidas (diarrea). Solicite ayuda de inmediato si:  El nivel de azcar en la sangre est por debajo de 54mg /dl (53mmol/l).  Se siente confundida.  Tiene dificultad para hacer lo siguiente: ? Pensar con claridad. ? Respirar.  El beb se mueve menos de lo normal.  Tiene alguno de estos sntomas: ? Niveles moderados o altos de cetonas en la orina. ? Sangre proveniente de la vagina. ? Secrecin de un lquido fuera de lo comn de la vagina. ? Contracciones prematuras. Estas pueden causar una sensacin de opresin en el vientre. Resumen  La diabetes gestacional es una forma de diabetes a corto plazo. Puede suceder mientras est embarazada. Este cuadro desaparece despus del parto.  Si la diabetes gestacional se trata, es posible que ni usted ni el beb se vean afectados. El mdico fijar los objetivos del tratamiento para usted.  Concurra a todas las visitas de seguimiento como se lo haya indicado el mdico.  Esto es importante. Esta informacin no tiene Marine scientist el consejo del mdico. Asegrese de hacerle al mdico cualquier pregunta que tenga. Document Released: 05/28/2015 Document Revised: 12/02/2016 Document Reviewed: 03/09/2015 Elsevier Patient Education  2020 [employer].

## 2018-11-02 NOTE — BH Specialist Note (Signed)
Pt did not arrive to [facility] webex appointment, and did not answer the phone; Left HIPPA-compliant message to call back Roselyn Reef from [facility] for [employer] at [phone], via [place] interpreter, [place], [place].   Integrated Behavioral Health via Telemedicine Video Visit  11/02/2018 Kamyrah Feeser [phone]  Garlan Fair

## 2018-11-03 ENCOUNTER — Ambulatory Visit: Payer: Self-pay | Admitting: Clinical

## 2018-11-03 ENCOUNTER — Other Ambulatory Visit: Payer: Self-pay

## 2018-11-03 DIAGNOSIS — Z5329 Procedure and treatment not carried out because of patient's decision for other reasons: Secondary | ICD-10-CM

## 2018-11-03 DIAGNOSIS — Z91199 Patient's noncompliance with other medical treatment and regimen due to unspecified reason: Secondary | ICD-10-CM

## 2018-11-09 ENCOUNTER — Telehealth: Payer: Self-pay

## 2018-11-09 NOTE — Telephone Encounter (Signed)
Pt called nurse voicemail requesting a call back.   Pt called with spanish interpreter, Eda; VM left requesting pt to call back if she needs further assistance.

## 2018-11-15 ENCOUNTER — Telehealth: Payer: Self-pay

## 2018-11-15 DIAGNOSIS — O24410 Gestational diabetes mellitus in pregnancy, diet controlled: Secondary | ICD-10-CM

## 2018-11-15 NOTE — Telephone Encounter (Signed)
Pt called and left message on VM in spanish. Will call pt back with an interpreter.

## 2018-11-18 NOTE — Telephone Encounter (Signed)
Called patient with pacific interpreter [phone] stating I am returning her phone call. Patient states she called because she is out of strips & lancets and cannot buy it over the counter at the store- she was told she needed a prescription. Asked patient if she had insurance and she states no. Told patient she may come by the office to pick up the supplies. Patient verbalized understanding & had no questions. Per chart review, patient has transfer of care visit with [facility] office on 10/8.

## 2018-11-25 ENCOUNTER — Other Ambulatory Visit: Payer: Self-pay

## 2018-11-25 ENCOUNTER — Other Ambulatory Visit (HOSPITAL_COMMUNITY)
Admission: RE | Admit: 2018-11-25 | Discharge: 2018-11-25 | Disposition: A | Discharge status: Home / Self Care | Payer: Medicaid Other | Source: Ambulatory Visit | Attending: Obstetrics and Gynecology | Admitting: Obstetrics and Gynecology

## 2018-11-25 ENCOUNTER — Encounter: Payer: Self-pay | Admitting: Obstetrics and Gynecology

## 2018-11-25 ENCOUNTER — Ambulatory Visit (INDEPENDENT_AMBULATORY_CARE_PROVIDER_SITE_OTHER): Payer: Self-pay | Admitting: Obstetrics and Gynecology

## 2018-11-25 VITALS — BP 109/69 | HR 79 | Wt 160.6 lb

## 2018-11-25 DIAGNOSIS — Z3480 Encounter for supervision of other normal pregnancy, unspecified trimester: Secondary | ICD-10-CM

## 2018-11-25 DIAGNOSIS — Z113 Encounter for screening for infections with a predominantly sexual mode of transmission: Secondary | ICD-10-CM

## 2018-11-25 DIAGNOSIS — O99283 Endocrine, nutritional and metabolic diseases complicating pregnancy, third trimester: Secondary | ICD-10-CM

## 2018-11-25 DIAGNOSIS — O24410 Gestational diabetes mellitus in pregnancy, diet controlled: Secondary | ICD-10-CM

## 2018-11-25 DIAGNOSIS — E039 Hypothyroidism, unspecified: Secondary | ICD-10-CM

## 2018-11-25 DIAGNOSIS — Z3A36 36 weeks gestation of pregnancy: Secondary | ICD-10-CM

## 2018-11-25 MED ORDER — METFORMIN HCL 1000 MG PO TABS: 1000.0000 mg | ORAL_TABLET | Freq: Every day | ORAL | 1 refills | Status: DC

## 2018-11-25 NOTE — Progress Notes (Signed)
   PRENATAL VISIT NOTE  Subjective:  Joanne [name] is a 38 y.o. G3P2002 at [redacted]w[redacted]d being seen today for ongoing prenatal care.  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  Patient reports generalized pruritis.  Contractions: Not present. Vag. Bleeding: None.  Movement: Present. Denies leaking of fluid.   The following portions of the patient's history were reviewed and updated as appropriate: allergies, current medications, past family history, past medical history, past social history, past surgical history and problem list.   Objective:   Vitals:   11/25/18 1431  BP: 109/69  Pulse: 79  Weight: 160 lb 9.6 oz (72.8 kg)    Fetal Status: Fetal Heart Rate (bpm): 131 Fundal Height: 36 cm Movement: Present  Presentation: Vertex  General:  Alert, oriented and cooperative. Patient is in no acute distress.  Skin: Skin is warm and dry. No rash noted.   Cardiovascular: Normal heart rate noted  Respiratory: Normal respiratory effort, no problems with respiration noted  Abdomen: Soft, gravid, appropriate for gestational age.  Pain/Pressure: Present     Pelvic: Cervical exam performed Dilation: 1 Effacement (%): Thick Station: Ballotable  Extremities: Normal range of motion.  Edema: Trace  Mental Status: Normal mood and affect. Normal behavior. Normal judgment and thought content.   Assessment and Plan:  Pregnancy: G3P2002 at [redacted]w[redacted]d 1. Supervision of other normal pregnancy, antepartum Patient is doing well with generalized pruritis Bile acids ordered to rule out cholestasis of pregnancy Cultures today EFW 52%tile on 11/23/18 - Strep Gp  B NAA - Cervicovaginal ancillary only( [place]) - Bile acids, total  2. Gestational diabetes mellitus, class A1 CBG2 reviewed and post lunch and dinner remain elevated Will increase metformin 1000 mg daily. Patient admits to dietary indiscretions   3. 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 Please refer to After Visit Summary for other counseling recommendations.   Return in about 1 week (around 12/02/2018) for in person, ROB.  No future appointments.  Mora Bellman, MD

## 2018-11-25 NOTE — Progress Notes (Addendum)
[facility] ROB/GBS.  Declined Flu vaccine.  C/o pressure and back pain 7-9/10 x 3 weeks.  Headaches 6/10 x 1 week, dizziness, fatigue and itching all  Over her body x 1 week, also tingling in feet and hands x 3 days .  Denies Blurry vision, runny nose, NV

## 2018-11-27 LAB — BILE ACIDS, TOTAL: Bile Acids Total: 5.5 umol/L (ref 0.0–10.0)

## 2018-11-27 LAB — STREP GP B NAA: Strep Gp B NAA: NEGATIVE

## 2018-11-30 ENCOUNTER — Encounter (HOSPITAL_COMMUNITY)
Admission: AD | Disposition: A | Discharge status: Home / Self Care | Payer: Self-pay | Source: Home / Self Care | Attending: Obstetrics and Gynecology

## 2018-11-30 ENCOUNTER — Inpatient Hospital Stay (HOSPITAL_COMMUNITY): Payer: Medicaid Other | Admitting: Certified Registered"

## 2018-11-30 ENCOUNTER — Inpatient Hospital Stay (HOSPITAL_COMMUNITY)
Admission: AD | Admit: 2018-11-30 | Discharge: 2018-12-02 | DRG: 788 | Disposition: A | Discharge status: Home / Self Care | Payer: Medicaid Other | Attending: Obstetrics and Gynecology | Admitting: Obstetrics and Gynecology

## 2018-11-30 ENCOUNTER — Inpatient Hospital Stay (HOSPITAL_COMMUNITY): Payer: Medicaid Other

## 2018-11-30 ENCOUNTER — Encounter (HOSPITAL_COMMUNITY): Payer: Self-pay | Admitting: *Deleted

## 2018-11-30 DIAGNOSIS — O99284 Endocrine, nutritional and metabolic diseases complicating childbirth: Secondary | ICD-10-CM | POA: Diagnosis present

## 2018-11-30 DIAGNOSIS — Z3A37 37 weeks gestation of pregnancy: Secondary | ICD-10-CM

## 2018-11-30 DIAGNOSIS — E039 Hypothyroidism, unspecified: Secondary | ICD-10-CM | POA: Diagnosis present

## 2018-11-30 DIAGNOSIS — Z3480 Encounter for supervision of other normal pregnancy, unspecified trimester: Secondary | ICD-10-CM

## 2018-11-30 DIAGNOSIS — O24410 Gestational diabetes mellitus in pregnancy, diet controlled: Secondary | ICD-10-CM | POA: Diagnosis present

## 2018-11-30 DIAGNOSIS — O26892 Other specified pregnancy related conditions, second trimester: Secondary | ICD-10-CM

## 2018-11-30 DIAGNOSIS — Z148 Genetic carrier of other disease: Secondary | ICD-10-CM

## 2018-11-30 DIAGNOSIS — Z3689 Encounter for other specified antenatal screening: Secondary | ICD-10-CM

## 2018-11-30 DIAGNOSIS — O26893 Other specified pregnancy related conditions, third trimester: Secondary | ICD-10-CM | POA: Diagnosis present

## 2018-11-30 DIAGNOSIS — O24420 Gestational diabetes mellitus in childbirth, diet controlled: Secondary | ICD-10-CM | POA: Diagnosis not present

## 2018-11-30 DIAGNOSIS — O99892 Other specified diseases and conditions complicating childbirth: Secondary | ICD-10-CM

## 2018-11-30 DIAGNOSIS — Z524 Kidney donor: Secondary | ICD-10-CM

## 2018-11-30 DIAGNOSIS — O99013 Anemia complicating pregnancy, third trimester: Secondary | ICD-10-CM

## 2018-11-30 DIAGNOSIS — Z98891 History of uterine scar from previous surgery: Secondary | ICD-10-CM

## 2018-11-30 DIAGNOSIS — Z20828 Contact with and (suspected) exposure to other viral communicable diseases: Secondary | ICD-10-CM | POA: Diagnosis present

## 2018-11-30 DIAGNOSIS — O34211 Maternal care for low transverse scar from previous cesarean delivery: Secondary | ICD-10-CM | POA: Diagnosis present

## 2018-11-30 DIAGNOSIS — O99280 Endocrine, nutritional and metabolic diseases complicating pregnancy, unspecified trimester: Secondary | ICD-10-CM | POA: Diagnosis present

## 2018-11-30 HISTORY — PX: CYSTOSCOPY: SHX5120

## 2018-11-30 LAB — COMPREHENSIVE METABOLIC PANEL
ALT: 22 U/L (ref 0–44)
AST: 25 U/L (ref 15–41)
Albumin: 2.6 g/dL — ABNORMAL LOW (ref 3.5–5.0)
Alkaline Phosphatase: 126 U/L (ref 38–126)
Anion gap: 13 (ref 5–15)
BUN: 7 mg/dL (ref 6–20)
CO2: 17 mmol/L — ABNORMAL LOW (ref 22–32)
Calcium: 8.1 mg/dL — ABNORMAL LOW (ref 8.9–10.3)
Chloride: 106 mmol/L (ref 98–111)
Creatinine, Ser: 0.84 mg/dL (ref 0.44–1.00)
GFR calc Af Amer: >60 mL/min (ref >60)
GFR calc non Af Amer: >60 mL/min (ref >60)
Glucose, Bld: 103 mg/dL — ABNORMAL HIGH (ref 70–99)
Potassium: 4.6 mmol/L (ref 3.5–5.1)
Sodium: 136 mmol/L (ref 135–145)
Total Bilirubin: 0.6 mg/dL (ref 0.3–1.2)
Total Protein: 5.1 g/dL — ABNORMAL LOW (ref 6.5–8.1)

## 2018-11-30 LAB — RPR: RPR Ser Ql: NONREACTIVE

## 2018-11-30 LAB — CBC
HCT: 25.3 % — ABNORMAL LOW (ref 36.0–46.0)
HCT: 22.1 % — ABNORMAL LOW (ref 36.0–46.0)
Hemoglobin: 7.9 g/dL — ABNORMAL LOW (ref 12.0–15.0)
Hemoglobin: 7 g/dL — ABNORMAL LOW (ref 12.0–15.0)
MCH: 23.9 pg — ABNORMAL LOW (ref 26.0–34.0)
MCH: 23.9 pg — ABNORMAL LOW (ref 26.0–34.0)
MCHC: 31.2 g/dL (ref 30.0–36.0)
MCHC: 31.7 g/dL (ref 30.0–36.0)
MCV: 76.4 fL — ABNORMAL LOW (ref 80.0–100.0)
MCV: 75.4 fL — ABNORMAL LOW (ref 80.0–100.0)
Platelets: 249 10*3/uL (ref 150–400)
Platelets: 238 10*3/uL (ref 150–400)
RBC: 3.31 MIL/uL — ABNORMAL LOW (ref 3.87–5.11)
RBC: 2.93 MIL/uL — ABNORMAL LOW (ref 3.87–5.11)
RDW: 15.6 % — ABNORMAL HIGH (ref 11.5–15.5)
RDW: 15.5 % (ref 11.5–15.5)
WBC: 16 10*3/uL — ABNORMAL HIGH (ref 4.0–10.5)
WBC: 13.6 10*3/uL — ABNORMAL HIGH (ref 4.0–10.5)
nRBC: 0 % (ref 0.0–0.2)
nRBC: 0 % (ref 0.0–0.2)

## 2018-11-30 LAB — TYPE AND SCREEN
ABO/RH(D): O POS
Antibody Screen: NEGATIVE

## 2018-11-30 LAB — SARS CORONAVIRUS 2 BY RT PCR (HOSPITAL ORDER, PERFORMED IN ~~LOC~~ HOSPITAL LAB): SARS Coronavirus 2: NEGATIVE

## 2018-11-30 SURGERY — Surgical Case
Anesthesia: General

## 2018-11-30 MED ORDER — MEPERIDINE HCL 25 MG/ML IJ SOLN: 6.2500 mg | INTRAMUSCULAR | Status: DC | PRN

## 2018-11-30 MED ORDER — CEFAZOLIN SODIUM-DEXTROSE 2-4 GM/100ML-% IV SOLN
INTRAVENOUS | Status: AC
  Filled 2018-11-30: qty 100

## 2018-11-30 MED ORDER — HYDROMORPHONE HCL 1 MG/ML IJ SOLN
INTRAMUSCULAR | Status: AC
  Filled 2018-11-30: qty 0.5

## 2018-11-30 MED ORDER — SUCCINYLCHOLINE CHLORIDE 20 MG/ML IJ SOLN
INTRAMUSCULAR | Status: DC | PRN
  Administered 2018-11-30: 140 mg via INTRAVENOUS

## 2018-11-30 MED ORDER — OXYCODONE HCL 5 MG PO TABS
5.0000 mg | ORAL_TABLET | ORAL | Status: DC | PRN
  Administered 2018-11-30 (×2): 5 mg via ORAL
  Administered 2018-12-01: 10 mg via ORAL
  Administered 2018-12-01: 5 mg via ORAL
  Administered 2018-12-01 – 2018-12-02 (×5): 10 mg via ORAL
  Filled 2018-11-30: qty 1
  Filled 2018-11-30 (×2): qty 2
  Filled 2018-11-30 (×2): qty 1
  Filled 2018-11-30 (×4): qty 2

## 2018-11-30 MED ORDER — ENOXAPARIN SODIUM 40 MG/0.4ML ~~LOC~~ SOLN
40.0000 mg | SUBCUTANEOUS | Status: DC
  Administered 2018-12-01: 40 mg via SUBCUTANEOUS
  Filled 2018-11-30 (×2): qty 0.4

## 2018-11-30 MED ORDER — SOD CITRATE-CITRIC ACID 500-334 MG/5ML PO SOLN: 30.0000 mL | ORAL | Status: DC | PRN

## 2018-11-30 MED ORDER — LACTATED RINGERS IV SOLN: 500.0000 mL | INTRAVENOUS | Status: DC | PRN

## 2018-11-30 MED ORDER — MIDAZOLAM HCL 2 MG/2ML IJ SOLN
INTRAMUSCULAR | Status: AC
  Filled 2018-11-30: qty 2

## 2018-11-30 MED ORDER — MIDAZOLAM HCL 2 MG/2ML IJ SOLN
INTRAMUSCULAR | Status: DC | PRN
  Administered 2018-11-30: 2 mg via INTRAVENOUS

## 2018-11-30 MED ORDER — SIMETHICONE 80 MG PO CHEW: 80.0000 mg | CHEWABLE_TABLET | ORAL | Status: DC | PRN

## 2018-11-30 MED ORDER — PHENYLEPHRINE 40 MCG/ML (10ML) SYRINGE FOR IV PUSH (FOR BLOOD PRESSURE SUPPORT)
PREFILLED_SYRINGE | INTRAVENOUS | Status: AC
  Filled 2018-11-30: qty 10

## 2018-11-30 MED ORDER — SENNOSIDES-DOCUSATE SODIUM 8.6-50 MG PO TABS
2.0000 | ORAL_TABLET | ORAL | Status: DC
  Administered 2018-11-30 – 2018-12-02 (×2): 2 via ORAL
  Filled 2018-11-30 (×2): qty 2

## 2018-11-30 MED ORDER — TETANUS-DIPHTH-ACELL PERTUSSIS 5-2.5-18.5 LF-MCG/0.5 IM SUSP: 0.5000 mL | Freq: Once | INTRAMUSCULAR | Status: DC

## 2018-11-30 MED ORDER — ACETAMINOPHEN 325 MG PO TABS: 650.0000 mg | ORAL_TABLET | ORAL | Status: DC | PRN

## 2018-11-30 MED ORDER — TRANEXAMIC ACID-NACL 1000-0.7 MG/100ML-% IV SOLN
1000.0000 mg | Freq: Once | INTRAVENOUS | Status: AC
  Administered 2018-11-30: 02:00:00 1000 mg via INTRAVENOUS

## 2018-11-30 MED ORDER — PHENYLEPHRINE HCL (PRESSORS) 10 MG/ML IV SOLN: INTRAVENOUS | Status: DC | PRN

## 2018-11-30 MED ORDER — SUGAMMADEX SODIUM 200 MG/2ML IV SOLN
INTRAVENOUS | Status: DC | PRN
  Administered 2018-11-30: 200 mg via INTRAVENOUS

## 2018-11-30 MED ORDER — ACETAMINOPHEN 10 MG/ML IV SOLN
1000.0000 mg | Freq: Once | INTRAVENOUS | Status: AC
  Administered 2018-11-30: 1000 mg via INTRAVENOUS

## 2018-11-30 MED ORDER — LACTATED RINGERS IV SOLN
INTRAVENOUS | Status: DC
  Administered 2018-11-30: 11:00:00 via INTRAVENOUS

## 2018-11-30 MED ORDER — ONDANSETRON HCL 4 MG/2ML IJ SOLN
INTRAMUSCULAR | Status: DC | PRN
  Administered 2018-11-30: 4 mg via INTRAVENOUS

## 2018-11-30 MED ORDER — LIDOCAINE HCL (PF) 1 % IJ SOLN: 30.0000 mL | INTRAMUSCULAR | Status: DC | PRN

## 2018-11-30 MED ORDER — LACTATED RINGERS IV SOLN: INTRAVENOUS | Status: DC

## 2018-11-30 MED ORDER — ONDANSETRON HCL 4 MG/2ML IJ SOLN: 4.0000 mg | Freq: Four times a day (QID) | INTRAMUSCULAR | Status: DC | PRN

## 2018-11-30 MED ORDER — OXYCODONE HCL 5 MG/5ML PO SOLN: 5.0000 mg | Freq: Once | ORAL | Status: DC | PRN

## 2018-11-30 MED ORDER — DIBUCAINE (PERIANAL) 1 % EX OINT: 1.0000 "application " | TOPICAL_OINTMENT | CUTANEOUS | Status: DC | PRN

## 2018-11-30 MED ORDER — ONDANSETRON HCL 4 MG/2ML IJ SOLN
INTRAMUSCULAR | Status: AC
  Filled 2018-11-30: qty 2

## 2018-11-30 MED ORDER — HYDROMORPHONE HCL 1 MG/ML IJ SOLN
0.2500 mg | INTRAMUSCULAR | Status: DC | PRN
  Administered 2018-11-30 (×2): 0.5 mg via INTRAVENOUS
  Administered 2018-11-30: 0.25 mg via INTRAVENOUS

## 2018-11-30 MED ORDER — WITCH HAZEL-GLYCERIN EX PADS: 1.0000 "application " | MEDICATED_PAD | CUTANEOUS | Status: DC | PRN

## 2018-11-30 MED ORDER — OXYCODONE HCL 5 MG PO TABS: 5.0000 mg | ORAL_TABLET | Freq: Once | ORAL | Status: DC | PRN

## 2018-11-30 MED ORDER — ZOLPIDEM TARTRATE 5 MG PO TABS: 5.0000 mg | ORAL_TABLET | Freq: Every evening | ORAL | Status: DC | PRN

## 2018-11-30 MED ORDER — DEXAMETHASONE SODIUM PHOSPHATE 10 MG/ML IJ SOLN
INTRAMUSCULAR | Status: AC
  Filled 2018-11-30: qty 1

## 2018-11-30 MED ORDER — COCONUT OIL OIL
1.0000 "application " | TOPICAL_OIL | Status: DC | PRN
  Administered 2018-12-01: 1 via TOPICAL

## 2018-11-30 MED ORDER — SODIUM CHLORIDE 0.9 % IV SOLN
INTRAVENOUS | Status: DC | PRN
  Administered 2018-11-30: 02:00:00 via INTRAVENOUS

## 2018-11-30 MED ORDER — ACETAMINOPHEN 325 MG PO TABS
650.0000 mg | ORAL_TABLET | ORAL | Status: DC | PRN
  Administered 2018-11-30 – 2018-12-02 (×5): 650 mg via ORAL
  Filled 2018-11-30 (×5): qty 2

## 2018-11-30 MED ORDER — MENTHOL 3 MG MT LOZG: 1.0000 | LOZENGE | OROMUCOSAL | Status: DC | PRN

## 2018-11-30 MED ORDER — ACETAMINOPHEN 10 MG/ML IV SOLN
INTRAVENOUS | Status: AC
  Filled 2018-11-30: qty 100

## 2018-11-30 MED ORDER — ROCURONIUM BROMIDE 10 MG/ML (PF) SYRINGE
PREFILLED_SYRINGE | INTRAVENOUS | Status: AC
  Filled 2018-11-30: qty 10

## 2018-11-30 MED ORDER — OXYCODONE-ACETAMINOPHEN 5-325 MG PO TABS: 2.0000 | ORAL_TABLET | ORAL | Status: DC | PRN

## 2018-11-30 MED ORDER — OXYTOCIN 40 UNITS IN NORMAL SALINE INFUSION - SIMPLE MED
INTRAVENOUS | Status: AC
  Filled 2018-11-30: qty 1000

## 2018-11-30 MED ORDER — OXYTOCIN 40 UNITS IN NORMAL SALINE INFUSION - SIMPLE MED: 2.5000 [IU]/h | INTRAVENOUS | Status: AC

## 2018-11-30 MED ORDER — TRANEXAMIC ACID-NACL 1000-0.7 MG/100ML-% IV SOLN
INTRAVENOUS | Status: AC
  Administered 2018-11-30: 1000 mg via INTRAVENOUS
  Filled 2018-11-30: qty 100

## 2018-11-30 MED ORDER — FENTANYL CITRATE (PF) 250 MCG/5ML IJ SOLN
INTRAMUSCULAR | Status: AC
  Filled 2018-11-30: qty 5

## 2018-11-30 MED ORDER — SIMETHICONE 80 MG PO CHEW
80.0000 mg | CHEWABLE_TABLET | Freq: Three times a day (TID) | ORAL | Status: DC
  Administered 2018-11-30 – 2018-12-02 (×8): 80 mg via ORAL
  Filled 2018-11-30 (×8): qty 1

## 2018-11-30 MED ORDER — PROPOFOL 10 MG/ML IV BOLUS
INTRAVENOUS | Status: AC
  Filled 2018-11-30: qty 20

## 2018-11-30 MED ORDER — ROCURONIUM BROMIDE 100 MG/10ML IV SOLN
INTRAVENOUS | Status: DC | PRN
  Administered 2018-11-30: 10 mg via INTRAVENOUS

## 2018-11-30 MED ORDER — DEXAMETHASONE SODIUM PHOSPHATE 10 MG/ML IJ SOLN
INTRAMUSCULAR | Status: DC | PRN
  Administered 2018-11-30: 10 mg via INTRAVENOUS

## 2018-11-30 MED ORDER — PROPOFOL 10 MG/ML IV BOLUS
INTRAVENOUS | Status: DC | PRN
  Administered 2018-11-30: 200 mg via INTRAVENOUS

## 2018-11-30 MED ORDER — SIMETHICONE 80 MG PO CHEW
80.0000 mg | CHEWABLE_TABLET | ORAL | Status: DC
  Administered 2018-11-30 – 2018-12-02 (×2): 80 mg via ORAL
  Filled 2018-11-30 (×2): qty 1

## 2018-11-30 MED ORDER — ALBUMIN HUMAN 5 % IV SOLN
INTRAVENOUS | Status: DC | PRN
  Administered 2018-11-30: 03:00:00 via INTRAVENOUS

## 2018-11-30 MED ORDER — OXYTOCIN BOLUS FROM INFUSION: 500.0000 mL | Freq: Once | INTRAVENOUS | Status: DC

## 2018-11-30 MED ORDER — OXYTOCIN 40 UNITS IN NORMAL SALINE INFUSION - SIMPLE MED
2.5000 [IU]/h | INTRAVENOUS | Status: DC
  Filled 2018-11-30: qty 1000

## 2018-11-30 MED ORDER — DIPHENHYDRAMINE HCL 25 MG PO CAPS: 25.0000 mg | ORAL_CAPSULE | Freq: Four times a day (QID) | ORAL | Status: DC | PRN

## 2018-11-30 MED ORDER — CEFAZOLIN SODIUM-DEXTROSE 2-3 GM-%(50ML) IV SOLR
INTRAVENOUS | Status: DC | PRN
  Administered 2018-11-30: 2 g via INTRAVENOUS

## 2018-11-30 MED ORDER — PROMETHAZINE HCL 25 MG/ML IJ SOLN: 6.2500 mg | INTRAMUSCULAR | Status: DC | PRN

## 2018-11-30 MED ORDER — PRENATAL MULTIVITAMIN CH
1.0000 | ORAL_TABLET | Freq: Every day | ORAL | Status: DC
  Administered 2018-11-30 – 2018-12-02 (×3): 1 via ORAL
  Filled 2018-11-30 (×3): qty 1

## 2018-11-30 MED ORDER — HYDROMORPHONE HCL 1 MG/ML IJ SOLN
0.2000 mg | INTRAMUSCULAR | Status: DC | PRN
  Administered 2018-11-30 (×2): 0.6 mg via INTRAVENOUS
  Administered 2018-11-30: 0.4 mg via INTRAVENOUS
  Filled 2018-11-30 (×4): qty 1

## 2018-11-30 MED ORDER — FENTANYL CITRATE (PF) 100 MCG/2ML IJ SOLN
INTRAMUSCULAR | Status: DC | PRN
  Administered 2018-11-30: 50 ug via INTRAVENOUS
  Administered 2018-11-30: 100 ug via INTRAVENOUS
  Administered 2018-11-30: 250 ug via INTRAVENOUS
  Administered 2018-11-30: 100 ug via INTRAVENOUS

## 2018-11-30 MED ORDER — SUCCINYLCHOLINE CHLORIDE 200 MG/10ML IV SOSY
PREFILLED_SYRINGE | INTRAVENOUS | Status: AC
  Filled 2018-11-30: qty 10

## 2018-11-30 MED ORDER — PHENYLEPHRINE HCL (PRESSORS) 10 MG/ML IV SOLN
INTRAVENOUS | Status: DC | PRN
  Administered 2018-11-30 (×4): 40 ug via INTRAVENOUS

## 2018-11-30 MED ORDER — LACTATED RINGERS IV SOLN
INTRAVENOUS | Status: DC | PRN
  Administered 2018-11-30 (×3): via INTRAVENOUS

## 2018-11-30 MED ORDER — OXYCODONE-ACETAMINOPHEN 5-325 MG PO TABS: 1.0000 | ORAL_TABLET | ORAL | Status: DC | PRN

## 2018-11-30 MED ORDER — OXYTOCIN 40 UNITS IN NORMAL SALINE INFUSION - SIMPLE MED
INTRAVENOUS | Status: DC | PRN
  Administered 2018-11-30: 40 [IU] via INTRAVENOUS

## 2018-11-30 MED ORDER — METHYLERGONOVINE MALEATE 0.2 MG/ML IJ SOLN
INTRAMUSCULAR | Status: DC | PRN
  Administered 2018-11-30: 0.2 mg via INTRAMUSCULAR

## 2018-11-30 SURGICAL SUPPLY — 42 items
BENZOIN TINCTURE PRP APPL 2/3 (GAUZE/BANDAGES/DRESSINGS) ×4 IMPLANT
CATH FOLEY LATEX FREE 14FR (CATHETERS) ×2
CATH FOLEY LF 14FR (CATHETERS) ×2 IMPLANT
CHLORAPREP W/TINT 26ML (MISCELLANEOUS) ×4 IMPLANT
CLAMP CORD UMBIL (MISCELLANEOUS) IMPLANT
CLOSURE STERI STRIP 1/2 X4 (GAUZE/BANDAGES/DRESSINGS) ×4 IMPLANT
CLOSURE WOUND 1/2 X4 (GAUZE/BANDAGES/DRESSINGS)
CLOTH BEACON ORANGE TIMEOUT ST (SAFETY) ×4 IMPLANT
DRSG OPSITE POSTOP 4X10 (GAUZE/BANDAGES/DRESSINGS) ×4 IMPLANT
ELECT REM PT RETURN 9FT ADLT (ELECTROSURGICAL) ×4
ELECTRODE REM PT RTRN 9FT ADLT (ELECTROSURGICAL) ×2 IMPLANT
EXTRACTOR VACUUM BELL STYLE (SUCTIONS) IMPLANT
GAUZE SPONGE 4X4 12PLY STRL LF (GAUZE/BANDAGES/DRESSINGS) ×8 IMPLANT
GLOVE BIOGEL PI IND STRL 6.5 (GLOVE) ×2 IMPLANT
GLOVE BIOGEL PI IND STRL 7.0 (GLOVE) ×4 IMPLANT
GLOVE BIOGEL PI INDICATOR 6.5 (GLOVE) ×2
GLOVE BIOGEL PI INDICATOR 7.0 (GLOVE) ×4
GLOVE ORTHOPEDIC STR SZ6.5 (GLOVE) ×4 IMPLANT
GOWN STRL REUS W/TWL LRG LVL3 (GOWN DISPOSABLE) ×12 IMPLANT
HEMOSTAT SURGICEL 2X14 (HEMOSTASIS) ×4 IMPLANT
KIT ABG SYR 3ML LUER SLIP (SYRINGE) IMPLANT
NEEDLE HYPO 22GX1.5 SAFETY (NEEDLE) ×4 IMPLANT
NEEDLE HYPO 25X1 1.5 SAFETY (NEEDLE) IMPLANT
NS IRRIG 1000ML POUR BTL (IV SOLUTION) ×4 IMPLANT
PACK C SECTION WH (CUSTOM PROCEDURE TRAY) ×4 IMPLANT
PAD ABD 7.5X8 STRL (GAUZE/BANDAGES/DRESSINGS) ×4 IMPLANT
PAD OB MATERNITY 4.3X12.25 (PERSONAL CARE ITEMS) ×4 IMPLANT
PENCIL SMOKE EVAC W/HOLSTER (ELECTROSURGICAL) ×4 IMPLANT
SET CYSTO W/LG BORE CLAMP LF (SET/KITS/TRAYS/PACK) ×4 IMPLANT
STRIP CLOSURE SKIN 1/2X4 (GAUZE/BANDAGES/DRESSINGS) IMPLANT
SUT MON AB 4-0 PS1 27 (SUTURE) ×4 IMPLANT
SUT PLAIN 2 0 (SUTURE) ×2
SUT PLAIN ABS 2-0 CT1 27XMFL (SUTURE) ×2 IMPLANT
SUT VIC AB 0 CT1 36 (SUTURE) ×12 IMPLANT
SUT VIC AB 0 CTX 36 (SUTURE) ×2
SUT VIC AB 0 CTX36XBRD ANBCTRL (SUTURE) ×2 IMPLANT
SUT VIC AB 2-0 CT1 27 (SUTURE) ×4
SUT VIC AB 2-0 CT1 TAPERPNT 27 (SUTURE) ×4 IMPLANT
SYR CONTROL 10ML LL (SYRINGE) ×4 IMPLANT
TOWEL OR 17X24 6PK STRL BLUE (TOWEL DISPOSABLE) ×4 IMPLANT
TRAY FOLEY W/BAG SLVR 14FR LF (SET/KITS/TRAYS/PACK) ×4 IMPLANT
WATER STERILE IRR 1000ML POUR (IV SOLUTION) ×4 IMPLANT

## 2018-11-30 NOTE — Anesthesia Preprocedure Evaluation (Signed)
Anesthesia Evaluation  Patient identified by MRN, date of birth, ID band Patient awake    Reviewed: Allergy & Precautions, NPO status , Patient's Chart, lab work & pertinent test results  Airway Mallampati: II  TM Distance: >3 FB Neck ROM: Full    Dental no notable dental hx.    Pulmonary neg pulmonary ROS,    Pulmonary exam normal breath sounds clear to auscultation       Cardiovascular negative cardio ROS Normal cardiovascular exam Rhythm:Regular Rate:Normal     Neuro/Psych  Headaches, negative psych ROS   GI/Hepatic negative GI ROS, Neg liver ROS,   Endo/Other  diabetes, GestationalHypothyroidism   Renal/GU negative Renal ROS  negative genitourinary   Musculoskeletal negative musculoskeletal ROS (+)   Abdominal   Peds negative pediatric ROS (+)  Hematology negative hematology ROS (+)   Anesthesia Other Findings   Reproductive/Obstetrics (+) Pregnancy                             Anesthesia Physical Anesthesia Plan  ASA: II and emergent  Anesthesia Plan: General   Post-op Pain Management:    Induction: Intravenous, Rapid sequence and Cricoid pressure planned  PONV Risk Score and Plan: 3 and Ondansetron, Dexamethasone, Midazolam and Treatment may vary due to age or medical condition  Airway Management Planned: Oral ETT  Additional Equipment:   Intra-op Plan:   Post-operative Plan: Extubation in OR  Informed Consent: I have reviewed the patients History and Physical, chart, labs and discussed the procedure including the risks, benefits and alternatives for the proposed anesthesia with the patient or authorized representative who has indicated his/her understanding and acceptance.     Dental advisory given  Plan Discussed with: CRNA  Anesthesia Plan Comments:         Anesthesia Quick Evaluation

## 2018-11-30 NOTE — Discharge Summary (Signed)
Postpartum Discharge Summary     Patient Name: Joanne [name] DOB: 18-Jul-1980 MRN: [id number]  Date of admission: 11/30/2018 Delivering Provider: Sloan Leiter   Date of discharge: 12/02/2018  Admitting diagnosis: CTX,BLEEDING Intrauterine pregnancy: [redacted]w[redacted]d    Secondary diagnosis:  Active Problems:   Hypothyroidism complicating pregnancy   Kidney donor   Gestational diabetes mellitus, class A1   Normal labor   H/O cesarean section   [redacted] weeks gestation of pregnancy  Additional problems: Non-reassuring fetal heart tracing     Discharge diagnosis: Term Pregnancy Delivered                                                                                   Post partum procedures: Cystoscopy in the OR  Augmentation: AROM  Complications: STAT repeat Cesarean delivery  Hospital course:  Onset of Labor With Unplanned C/S  38y.o. yo G3P3003 at 325w2das [place]. Patient had a labor course significant for presenting in active labor, 8 cm dilated. After AROM with clear fluid, baby started having decelerations into the 50s. Initially the baby responded well to scalp stimulation, however the patient was poor at coping with labor. Vacuum assisted delivery was unable to be attempted due to persistant anterior lip and the patient was rushed back to the OR for a STAT Cesarean delivery.  Membrane Rupture Time/Date: 2:04 AM ,11/30/2018   The patient went for cesarean section due to Non-Reassuring FHR, and delivered a Viable infant,11/30/2018  Details of operation can be found in separate operative note. Patient had an uncomplicated postpartum course. Received Feraheme for post-op Hgb. Partner scheduled for vasectomy. She is ambulating,tolerating a regular diet, passing flatus, and urinating well.  Patient is discharged home in stable condition 12/02/18. Delivery time: 2:22 AM    Magnesium Sulfate received: No BMZ received:  No Rhophylac:N/A MMR:N/A Transfusion:No  Physical exam  Vitals:   12/01/18 0923 12/01/18 1500 12/01/18 2220 12/02/18 0538  BP: (!) 92/56 (!) 98/57 (!) 95/54 (!) 94/58  Pulse: 86 91 90 83  Resp: '17 16 18 18  ' Temp: 99 F (37.2 C) 98 F (36.7 C) 99.8 F (37.7 C) (!) 97.5 F (36.4 C)  TempSrc: Oral Oral Oral Oral  SpO2:  99% 98% 99%   General: alert, cooperative and no distress Lochia: appropriate Uterine Fundus: firm Incision: Healing well with no significant drainage DVT Evaluation: No evidence of DVT seen on physical exam. Labs: Lab Results  Component Value Date   WBC 13.6 (H) 11/30/2018   HGB 7.0 (L) 11/30/2018   HCT 22.1 (L) 11/30/2018   MCV 75.4 (L) 11/30/2018   PLT 238 11/30/2018   CMP Latest Ref Rng & Units 11/30/2018  Glucose 70 - 99 mg/dL 103(H)  BUN 6 - 20 mg/dL 7  Creatinine 0.44 - 1.00 mg/dL 0.84  Sodium 135 - 145 mmol/L 136  Potassium 3.5 - 5.1 mmol/L 4.6  Chloride 98 - 111 mmol/L 106  CO2 22 - 32 mmol/L 17(L)  Calcium 8.9 - 10.3 mg/dL 8.1(L)  Total Protein 6.5 - 8.1 g/dL 5.1(L)  Total Bilirubin 0.3 - 1.2 mg/dL 0.6  Alkaline Phos 38 - 126 U/L 126  AST 15 - 41 U/L 25  ALT 0 - 44 U/L 22    Discharge instruction: per After Visit Summary and "Baby and Me Booklet".  After visit meds:  Allergies as of 12/02/2018      Reactions   Nsaids Other (See Comments)   Patient with one kidney, avoid NSAIDS      Medication List    STOP taking these medications   metFORMIN 1000 MG tablet Commonly known as: GLUCOPHAGE   metFORMIN 500 MG 24 hr tablet Commonly known as: GLUCOPHAGE-XR     TAKE these medications   [place] 1 Units by Does not apply route daily as needed.   cyclobenzaprine 10 MG tablet Commonly known as: FLEXERIL Take 1 tablet (10 mg total) by mouth every 8 (eight) hours as needed for muscle spasms.   ferrous sulfate 325 (65 FE) MG EC tablet Take 1 tablet (325 mg total) by mouth 2 (two) times daily with a meal.    levothyroxine 75 MCG tablet Commonly known as: SYNTHROID Take 1 tablet (75 mcg total) by mouth daily.   Magnesium 200 MG Tabs Take 2 tablets (400 mg total) by mouth at bedtime.   oxyCODONE 5 MG immediate release tablet Commonly known as: Oxy IR/ROXICODONE Take 1-2 tablets (5-10 mg total) by mouth every 4 (four) hours as needed for moderate pain.   promethazine 25 MG tablet Commonly known as: PHENERGAN Take 1 tablet (25 mg total) by mouth every 6 (six) hours as needed for nausea or vomiting.       Diet: routine diet  Activity: Advance as tolerated. Pelvic rest for 6 weeks.   Outpatient follow up:4 weeks Follow up Appt: Future Appointments  Date Time Provider [place]  12/15/2018 10:30 AM Woodroe Mode, MD [place] None  12/28/2018 10:00 AM Sloan Leiter, MD [place] None  01/11/2019  9:30 AM [facility] None   Follow up Visit: Please schedule this patient for Postpartum visit in: 4 weeks with the following provider: MD For C/S patients schedule nurse incision check in weeks 2 weeks: yes High risk pregnancy complicated by: [id number], Hypothyroidism, one kidney (s/p L kidney donation 2009) Delivery mode:  CS Anticipated Birth Control:  vasectomy PP Procedures needed: Incision check, 2 hour GTT Schedule Integrated BH visit: no  Newborn Data: Live born female  Birth Weight: 2985g APGAR: 37, 98  Newborn Delivery   Birth date/time: 11/30/2018 02:22:00 Delivery type: C-Section, Low Transverse Trial of labor: Yes C-section categorization: Repeat      Baby Feeding: Breast Disposition:home with mother   12/02/2018 Chauncey Mann, MD

## 2018-11-30 NOTE — Transfer of Care (Signed)
Immediate Anesthesia Transfer of Care Note  Patient: Joanne [name]  Procedure(s) Performed: CESAREAN SECTION (N/A ) CYSTOSCOPY (Bilateral )  Patient Location: PACU  Anesthesia Type:General  Level of Consciousness: awake, alert , oriented and patient cooperative  Airway & Oxygen Therapy: Patient Spontanous Breathing and Patient connected to nasal cannula oxygen  Post-op Assessment: Report given to RN, Post -op Vital signs reviewed and stable and Patient moving all extremities X 4  Post vital signs: Reviewed and stable  Last Vitals:  Vitals Value Taken Time  BP    Temp 36.8 C 11/30/18 0434  Pulse    Resp    SpO2      Last Pain:  Vitals:   11/30/18 0208  PainSc: 10-Worst pain ever         Complications: No apparent anesthesia complications

## 2018-11-30 NOTE — H&P (Signed)
OBSTETRIC ADMISSION HISTORY AND PHYSICAL  This encounter was assisted by a [place] interpreter.  Joanne [name] is a 38 y.o. female G3P2002 with IUP at 6734w2d presenting for SOL with painful contractions every 1-2 minutes. She reports +FMs. No LOF, VB, blurry vision, headaches, peripheral edema, or RUQ pain. She plans on breastfeeding. Her FOB is planning on getting a vasectomy for birth control.  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 Chronic kidney disease    Pt has only one kidney, pt. donated a kidney. Pt. not having any problems  . Hypothyroid   . Hypothyroidism   . Postpartum hemorrhage 09/22/2014   EBL 851   . VBAC (vaginal birth after Cesarean) 09/22/2014    Past Surgical History: Past Surgical History:  Procedure Laterality Date  . CESAREAN SECTION  12/23/2010   Procedure: CESAREAN SECTION;  Surgeon: Kathreen CosierBernard A Marshall, MD;  Location: [facility];  Service: Gynecology;  Laterality: N/A;  . donated kidney      Obstetrical History: OB History    Gravida  3   Para  2   Term  2   Preterm      AB      Living  2     SAB      TAB      Ectopic      Multiple  0   Live Births  2           Social History: Social History   Socioeconomic History  . Marital status: Single    Spouse name: Not on file  . Number of children: 2  . Years of education: high school  . Highest education level: 12th grade  Occupational History  . Not on file  Social Needs  . Financial resource strain: Not very hard  . Food insecurity    Worry: Never true    Inability: Never true  . Transportation needs     Medical: No    Non-medical: No  Tobacco Use  . Smoking status: Never Smoker  . Smokeless tobacco: Never Used  Substance and Sexual Activity  . Alcohol use: No  . Drug use: No  . Sexual activity: Yes    Birth control/protection: None  Lifestyle  . Physical activity    Days per week: 4 days    Minutes per session: Not on file  . Stress: Only a little  Relationships  . Social Musicianconnections    Talks on phone: Never    Gets together: Never    Attends religious service: 1 to 4 times per year    Active member of club or organization: No    Attends meetings of clubs or organizations: Never    Relationship status: Never married  Other Topics Concern  . Not on file  Social History Narrative  . Not on file    Family History: Family History  Problem Relation Age of Onset  . Diabetes Maternal Aunt     Allergies: Allergies  Allergen Reactions  . Nsaids Other (See Comments)    Patient with one kidney, avoid NSAIDS    Medications Prior to Admission  Medication Sig Dispense Refill Last Dose  . cyclobenzaprine (FLEXERIL) 10 MG tablet  Take 1 tablet (10 mg total) by mouth every 8 (eight) hours as needed for muscle spasms. (Patient not taking: Reported on 11/25/2018) 30 tablet 1   . Elastic Bandages & Supports (COMFORT FIT MATERNITY SUPP SM) MISC 1 Units by Does not apply route daily as needed. 1 each 0   . ferrous sulfate 325 (65 FE) MG EC tablet Take 1 tablet (325 mg total) by mouth 2 (two) times daily with a meal. (Patient not taking: Reported on 11/25/2018) 60 tablet 4   . levothyroxine (SYNTHROID, LEVOTHROID) 75 MCG tablet Take 1 tablet (75 mcg total) by mouth daily. 90 tablet 3   . Magnesium 200 MG TABS Take 2 tablets (400 mg total) by mouth at bedtime. (Patient not taking: Reported on 11/25/2018) 30 each 2   . metFORMIN (GLUCOPHAGE) 1000 MG tablet Take 1 tablet (1,000 mg total) by mouth daily with breakfast. 30 tablet 1   . metFORMIN (GLUCOPHAGE-XR) 500 MG 24 hr tablet Take 1 tablet  (500 mg total) by mouth daily with breakfast. 30 tablet 1   . promethazine (PHENERGAN) 25 MG tablet Take 1 tablet (25 mg total) by mouth every 6 (six) hours as needed for nausea or vomiting. (Patient not taking: Reported on 11/25/2018) 30 tablet 1      Review of Systems   All systems reviewed and negative except as stated in HPI  Last menstrual period 03/14/2018. General appearance: alert, very uncomfortable with painful contractions Lungs: regular rate and effort Heart: regular rate  Abdomen: soft, non-tender Extremities: Homans sign is negative, no sign of DVT Presentation: cephalic Fetal monitoringBaseline: 155 bpm, Variability: Good {> 6 bpm), Accelerations: present and Decelerations: Absent Uterine activity contractions 2-3 minutes Dilation: 8 Effacement (%): 90 Exam by:: Sabas Sous, CNM   Prenatal labs: ABO, Rh: O/Positive/-- (04/23 1456) Antibody: Negative (04/23 1456) Rubella: 19.80 (04/23 1456) RPR: Non Reactive (08/13 0821)  HBsAg: Negative (04/23 1456)  HIV: Non Reactive (08/13 3875)  GBS: --Theda Sers (10/08 0329)  2 hr GTT- 143  Prenatal Transfer Tool  Maternal Diabetes: Yes:  Diabetes Type:  Diet controlled Genetic Screening: Normal Maternal Ultrasounds/Referrals: Normal Fetal Ultrasounds or other Referrals:  None Maternal Substance Abuse:  No Significant Maternal Medications:  None Significant Maternal Lab Results: Group B Strep negative  No results found for this or any previous visit (from the past 24 hour(s)).  Patient Active Problem List   Diagnosis Date Noted  . Gestational diabetes mellitus, class A1 10/02/2018  . Anemia affecting pregnancy in third trimester 10/02/2018  . 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 Headache in pregnancy, antepartum, second trimester 09/02/2018  . Renal disease during pregnancy 06/10/2018  . Supervision of other normal pregnancy, antepartum 06/01/2018  . Hypothyroidism complicating pregnancy 12/23/2010     Assessment: Joanne [name] is a 38 y.o. G3P2002 at [redacted]w[redacted]d here for SOL, TOLAC with Hx successful VBAC x1 (primary CS for transverse lie). -GBS negative -Cat 1 tracing -expectant management   Plan: Called urgently to bedside as patient was reporting the urge to push. Upon initial examination, she was thought to be complete with bulging bag. AROM was performed, with clear fluid. Repeat cervical exam showed an anterior lip. Patient attempted to push, however had poor pushing effort and multiple late decelerations. Initially decels responded to right lateral decubitus position and scalp stimulation, however they persisted. Dr. Earlene Plater called to the room to evaluate. Patient had poor pushing effort, and due to non-reassuring fetal heart tracing with persistent decelerations, she was consented and taken to  the OR for an emergency Cesarean section.  Caitlain Tweed L Effie Janoski, DO  11/30/2018, 1:47 AM

## 2018-11-30 NOTE — Lactation Note (Signed)
This note was copied from a baby's chart. Lactation Consultation Note  Patient Name: Joanne [name] SNKNL'Z Date: 11/30/2018 Reason for consult: Initial assessment;Early term 37-38.6wks;Maternal endocrine disorder Type of Endocrine Disorder?: Diabetes  LC in to visit with P3 Mom of ET infant at 42 hrs old.  Baby delivered by C/S due to fetal bradycardia.  Mom GDM and baby's 2 CBGs were 31 and 65.    Baby was fed two syringe feedings of formula 10 ml each.  Mom states she didn't ask for it.  Mom reports her goal is to exclusively breastfeed.  Baby showing subtle cues in his crib.  Offered to assist with positioning and latching.  Baby placed STS in football hold.  Baby didn't root or latch.  Reviewed breast massage and hand expression, colostrum drops expressed into spoon and fed to baby.  Baby left STS on Mom's chest.  Baby asleep.  Encouraged Mom to offer breast with any cue.  If baby too sleepy to feed at 3-4 hrs, to change diaper and try to stimulate baby to latch to breast.  Breast massage and hand expression to spoon recommended.  Lactation brochure given to Mom.  Mom aware of IP and OP lactation support available.  Encouraged to call prn.   Maternal Data Formula Feeding for Exclusion: No Has patient been taught Hand Expression?: Yes Does the patient have breastfeeding experience prior to this delivery?: Yes  Feeding Feeding Type: Breast Fed  LATCH Score Latch: Too sleepy or reluctant, no latch achieved, no sucking elicited.  Audible Swallowing: None  Type of Nipple: Everted at rest and after stimulation  Comfort (Breast/Nipple): Soft / non-tender  Hold (Positioning): Full assist, staff holds infant at breast  LATCH Score: 4  Interventions Interventions: Breast feeding basics reviewed;Skin to skin;Breast massage;Hand express    Consult Status Consult Status: Follow-up Date: 12/01/18 Follow-up type: [place] 11/30/2018, 11:35  AM

## 2018-11-30 NOTE — Progress Notes (Signed)
Called to room for prolonged decel, patient found to be anterior lip, 0 station with FHR in the 70s. Was able to reduce anterior lip and instructed patient to push which she did ineffectively, unable to safely apply vacuum due to anterior lip. Counseled patient that safest option would to proceed with repeat c-section, briefly counseled risks/benefits, she was agreeable to c-section. To OR stat. No consent signed due to emergent nature of case.  Consent done via Patent attorney.  [name], M.D. Attending Center for [employer] Fish farm manager)

## 2018-11-30 NOTE — Anesthesia Procedure Notes (Signed)
Procedure Name: Intubation Date/Time: 11/30/2018 2:27 AM Performed by: Lynda Rainwater, MD Pre-anesthesia Checklist: Patient identified, Emergency Drugs available, Suction available and Patient being monitored Patient Re-evaluated:Patient Re-evaluated prior to induction Oxygen Delivery Method: Circle System Utilized Preoxygenation: Pre-oxygenation with 100% oxygen Induction Type: IV induction, Cricoid Pressure applied and Rapid sequence Laryngoscope Size: Mac and 4 Grade View: Grade I Tube type: Oral Tube size: 7.0 mm Number of attempts: 1 Airway Equipment and Method: Stylet and Oral airway Placement Confirmation: ETT inserted through vocal cords under direct vision,  positive ETCO2 and breath sounds checked- equal and bilateral Tube secured with: Tape Dental Injury: Teeth and Oropharynx as per pre-operative assessment

## 2018-11-30 NOTE — Op Note (Signed)
[name] PROCEDURE DATE: 11/30/2018  PREOPERATIVE DIAGNOSES: Intrauterine pregnancy at [redacted]w[redacted]d weeks gestation; non-reassuring fetal status, h/o c-section  POSTOPERATIVE DIAGNOSES: The same  PROCEDURE: Repeat Low Transverse Cesarean Section, cystoscopy  SURGEON:  K. Therese Sarah, MD  ASSISTANT:  Dewitt Rota, DO  An experienced assistant was required given the standard of surgical care given the complexity of the case.  This assistant was needed for exposure, dissection, suctioning, retraction, instrument exchange, assisting with delivery with administration of fundal pressure, and for overall help during the procedure.  ANESTHESIOLOGY TEAM: Anesthesiologist: Lowella Curb, MD CRNA: Jennelle Human, CRNA; Orlie Pollen, CRNA  INDICATIONS: Joanne [name] is a 38 y.o. [phone] at [redacted]w[redacted]d here for cesarean section secondary to the indications listed under preoperative diagnoses; please see preoperative note for further details.  The risks of cesarean section were briefly discussed with the patient. She was agreeable to proceeding and gave verbal consent.  Consent done via spanish translator  FINDINGS:  Viable female infant in cephalic presentation.  Apgars 7 and 9. Weight: 2985 gms. Clear amniotic fluid.  Intact placenta, three vessel cord.  Uterus normal appearing with evidence of prior uterine incision, normal fallopian tubes and ovaries bilaterally, minimal adhesions.  ANESTHESIA: General anesthesia INTRAVENOUS FLUIDS: 1500  ml   ESTIMATED BLOOD LOSS: 933 ml URINE OUTPUT:  100 ml SPECIMENS: Placenta sent to pathology COMPLICATIONS: None immediate  PROCEDURE IN DETAIL:  The patient was taken to the OR urgently for stat repeat c-section. She received IV antibiotics in the OR. She was placed in a dorsal supine position with a leftward tilt. She was placed under general anesthesia and intubated without difficulty.  She was prepped with betadine and draped in a sterile  manner.  A foley catheter was placed into her bladder with sterile technique and attached to constant gravity. A Pfannenstiel skin incision was made with scalpel over her preexisting scar and carried through to the underlying layer of fascia. The fascia was incised in the midline, the fascial incision was extended bluntly. The rectus muscle was separated bluntly and the peritoneum was entered bluntly. A bladder blade was inserted and a low transverse hysterotomy made with a scalpel. This was extended bluntly and the infant was delivered from LOP position easily. Poor tone with delivery so cord was clamped and cut and infant handed to waiting NICU staff. Timeout then performed and an Alexis O retractor was placed into the incision taking care not to incarcerate bowel/omentum. The hysterotomy was closed with 0 vicryl and several figure of 8 sutures were placed to good effect for oozing. At the right aspect of the uterus, the broad ligament was noted to be torn completely through both leaves and there was some oozing along the right side of the uterus but no other bleeding noted. The uterus was gently exteriorized and the oozing was stopped with several figure of 8 sutures on the right aspect of the uterus near the level of the uterine artery. Afterward, good hemostasis noted and uterus gently replaced within abdomen. Hemostasis noted on all areas and surgicel placed into the right lateral aspect of the uterus where the broad ligament was torn. Surgicel also placed across hysterotomy. The fallopian tubes and ovaries were visualized bilaterally and normal appearing. The Alexis retractor was removed.  The pelvis was cleared of all clot and debris. Hemostasis was again confirmed on all surfaces. The fascia was then closed using 0 Vicryl in a running fashion.  The subcutaneous layer was irrigated, then reapproximated with 2-0  plain gut.  The skin was closed with a 4-0 Monocryl subcuticular stitch. Attention was then  turned to the cystoscopy. The patient was then put into dorsal lithotomy position and the foley was removed. She was prepped and the 70 degree cystoscope was inserted into her bladder. The bladder appeared normal. The right ureteral orifice was observed and noted to have a jet of urine. 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 The cystoscope was then removed. A foley was then placed under sterile conditions.   An xray was done to ensure no sponges left behind as counts were not completed prior to incision. No sponges seen on film per radiology. Patient was extubated in the OR without difficulty and taken to the recovery room. Instrument and needle counts were correct. The patient tolerated the procedure well. She was taken to the recovery room in stable condition.    [name], M.D. Attending [place], [facility] for [employer], [place]

## 2018-11-30 NOTE — MAU Note (Signed)
Patient presents via EMS w/ complaints of CTX since 1100 yesterday, now every 1-2 minutes.  Reports having a gush of bleeding prior to EMS bringing her in.  + FM.

## 2018-11-30 NOTE — Anesthesia Postprocedure Evaluation (Signed)
Anesthesia Post Note  Patient: Joanne [name]  Procedure(s) Performed: CESAREAN SECTION (N/A ) CYSTOSCOPY (Bilateral )     Patient location during evaluation: PACU Anesthesia Type: General Level of consciousness: awake and alert Pain management: pain level controlled Vital Signs Assessment: post-procedure vital signs reviewed and stable Respiratory status: spontaneous breathing, nonlabored ventilation and respiratory function stable Cardiovascular status: blood pressure returned to baseline and stable Postop Assessment: no apparent nausea or vomiting Anesthetic complications: no    Last Vitals:  Vitals:   11/30/18 0556 11/30/18 0600  BP:  105/63  Pulse: 71 71  Resp: 11 16  Temp:    SpO2: 97% 99%    Last Pain:  Vitals:   11/30/18 0536  PainSc: 7    Pain Goal:    LLE Motor Response: Purposeful movement (11/30/18 0536) LLE Sensation: Full sensation (11/30/18 0536) RLE Motor Response: Purposeful movement (11/30/18 0536) RLE Sensation: Full sensation (11/30/18 0536)        Lynda Rainwater

## 2018-12-01 ENCOUNTER — Other Ambulatory Visit: Payer: Self-pay

## 2018-12-01 LAB — ABO/RH: ABO/RH(D): O POS

## 2018-12-01 MED ORDER — SODIUM CHLORIDE 0.9 % IV SOLN
510.0000 mg | Freq: Once | INTRAVENOUS | Status: AC
  Administered 2018-12-01: 510 mg via INTRAVENOUS
  Filled 2018-12-01: qty 17

## 2018-12-01 MED ORDER — POLYETHYLENE GLYCOL 3350 17 G PO PACK
17.0000 g | PACK | Freq: Every day | ORAL | Status: DC
  Administered 2018-12-01 – 2018-12-02 (×2): 17 g via ORAL
  Filled 2018-12-01 (×2): qty 1

## 2018-12-01 NOTE — Progress Notes (Signed)
CSW received consult for hx of Depression.  CSW met with [name] to offer support and complete assessment.    CSW went to speak with [name] at bedside. Upon entering the room, CSW congratulated [name] and FOB On the birth of infant Joanne [name]). CSW used Spanish Speaking interpretor Gabriela to communicate with [name]. [name] was advised of the HIPPA policy and [name] reported that it was fine for FOB to remain in the room. CSW understanding and advised [name] of CSW's role and the reason for CSW coming to visit with her. [name] reports that she was never given a clinical diagnosis of depression, however she did speak with her OB-GYN during pregnancy regarding not feeling well. [name] reported that OB suggested that [name] may be dealing with depression as [name] reported to CSW and OB that she was worried as it was presumed that infant may have some genetic problems (per [name]'s report). [name] reports that she was not placed on medications but was in therapy. [name] reported that she was scheduled to go to therapy however stopped as she began to feel better. CSW understanding of this. [name] reports to CSW that she is feeling fine now and that there are no concerns for her. [name] denies SI and HI at this time.   CSW updated [name] that she scored 10 on her [place]. CSW inquired from [facility] on this. [name] unsure of what has been going on for her but again reported that she has been feeling fine and that is is just hormons and home schooling her children. [name] reports that she has all needed items to care for infant with no other needs at this time. Infant to be seen at [facility] on [address].  CSW provided education regarding the baby blues period vs. perinatal mood disorders, discussed treatment and gave resources for mental health follow up if concerns arise.  CSW recommends self-evaluation during the postpartum time period using the New Mom Checklist from Postpartum Progress and encouraged [name] to contact a medical professional if symptoms are noted at  any time.   CSW provided review of Sudden Infant Death Syndrome (SIDS) precautions.   CSW identifies no further need for intervention and no barriers to discharge at this time.    Joanne [name] Joanne [name], MSW, LCSW [facility] [place] [phone]

## 2018-12-01 NOTE — Progress Notes (Signed)
Subjective: Postpartum Day 1: Cesarean Delivery Patient reports some pain but denies any more pain medications at this time. She has ambulated and urinated. Tolerating a regular diet. Minimal lochia. Breastfeeding is going well. Some pain in upper arms.  Objective: Vital signs in last 24 hours: Temp:  [97.5 F (36.4 C)-98.4 F (36.9 C)] 98.4 F (36.9 C) (10/14 0048) Pulse Rate:  [62-91] 91 (10/14 0048) Resp:  [8-20] 19 (10/14 0048) BP: (89-113)/(51-74) 92/52 (10/14 0048) SpO2:  [97 %-100 %] 99 % (10/14 0048)  Physical Exam:  General: alert, cooperative and appears stated age Lochia: appropriate Uterine Fundus: firm Incision: no significant drainage DVT Evaluation: No evidence of DVT seen on physical exam.  Recent Labs    11/30/18 0453 11/30/18 0927  HGB 7.9* 7.0*  HCT 25.3* 22.1*    Assessment/Plan: Status post Cesarean section. Doing well postoperatively.  Continue current care. Breastfeeding Planning on partner vasectomy; appt scheduled Hgb 7.0; 9.5 2 months prior. Will give Feraheme.  Cr stable; s/p left kidney donor Anticipate DC POD#2 Hx of hypothyroid. TSH 3.0 on 8/27 GDMA1; GTT or A1c post partum.   [place] 12/01/2018, 4:16 AM

## 2018-12-02 DIAGNOSIS — Z3A37 37 weeks gestation of pregnancy: Secondary | ICD-10-CM

## 2018-12-02 LAB — SURGICAL PATHOLOGY

## 2018-12-02 MED ORDER — OXYCODONE HCL 5 MG PO TABS: 5.0000 mg | ORAL_TABLET | ORAL | 0 refills | Status: DC | PRN

## 2018-12-02 NOTE — Lactation Note (Signed)
This note was copied from a baby's chart. Lactation Consultation Note:  P3, infant is 31 hours old and ET at 37.2 weeks. Infant is at 7 % weight loss.  LC arrived in mothers room with Spanish interpreter at the bedside.   Mother reports that nipples are tender. She has bilateral cracks .  Informed mother that infant needed to take the entire nipple in his mouth. Discussed early term behaviors and the need for infant to transfer milk well.  Mother was given a hand pump with instructions to use after feeding.  To protect her milk supply. Suggested that infant may be supplemented with ebm.  Mother was given comfort gels.  Mother placed infant in cradle hold with infant very poorly latched. Infants lips pursed.  Suggested that mother try football hold. Infant opened his mouth wide and latched well.  Mother reluctant to received assistance. She reports she is having pain in her abdomin. Suggested that she ask for pain meds.  Suggested that mother follow with [facility] services as needed.  Mother informed of treatment and prevention of engorgement. She is aware of available [place] services and community support.    Patient Name: Joanne [name] XMIWO'E Date: 12/02/2018 Reason for consult: Follow-up assessment   Maternal Data    Feeding Feeding Type: Breast Fed  LATCH Score                   Interventions Interventions: Hand express;Comfort gels;Hand pump  Lactation Tools Discussed/Used     Consult Status      [name] 12/02/2018, 9:45 AM

## 2018-12-02 NOTE — Progress Notes (Signed)
Interpreter at bedside.

## 2018-12-03 ENCOUNTER — Encounter: Payer: Self-pay | Admitting: Nurse Practitioner

## 2018-12-06 LAB — CERVICOVAGINAL ANCILLARY ONLY
Chlamydia: NEGATIVE
Comment: NEGATIVE
Comment: NORMAL
Neisseria Gonorrhea: NEGATIVE

## 2018-12-12 NOTE — MAU Provider Note (Signed)
Late Entry  [id number] Joanne [name] [birth year]  Patient informed,via interpreter, that the ultrasound is considered a limited OB ultrasound and is not intended to be a complete ultrasound exam.  Patient also informed that the ultrasound is not being completed with the intent of assessing for fetal or placental anomalies or any pelvic abnormalities.  Explained that the purpose of today's ultrasound is to assess for  presentation.  Patient acknowledges the purpose of the exam and the limitations of the study.  [facility] Presentation Noted  Maryann Conners, CNM

## 2018-12-15 ENCOUNTER — Other Ambulatory Visit: Payer: Self-pay

## 2018-12-15 ENCOUNTER — Ambulatory Visit (INDEPENDENT_AMBULATORY_CARE_PROVIDER_SITE_OTHER): Payer: Self-pay | Admitting: Obstetrics & Gynecology

## 2018-12-15 ENCOUNTER — Encounter: Payer: Self-pay | Admitting: Obstetrics & Gynecology

## 2018-12-15 DIAGNOSIS — Z9889 Other specified postprocedural states: Secondary | ICD-10-CM

## 2018-12-15 NOTE — Progress Notes (Signed)
Subjective:     Joanne [name] is a 38 y.o. female who presents to the clinic 2 weeks status post repeat cesarean section for non-reassuring fetal heart tracing for at 37 weeks. Eating a regular diet without difficulty. Bowel movements are normal. Pain is controlled without any medications.  The following portions of the patient's history were reviewed and updated as appropriate: allergies, current medications, past family history, past medical history, past social history, past surgical history and problem list.  Review of Systems Pertinent items are noted in HPI.    Objective:    BP 99/61   Pulse 71   Wt 144 lb (65.3 kg)   Breastfeeding Yes   BMI 27.21 kg/m  General:  alert, cooperative and no distress  Abdomen: soft, non-tender  Incision:   healing well, no drainage, no erythema, no hernia, no seroma, no swelling, no dehiscence, incision well approximated     Assessment:    Doing well postoperatively. Operative findings again reviewed. Pathology report discussed.    Plan:    1. Continue any current medications. 2. Wound care discussed. 3. Activity restrictions: no lifting more than 15 pounds 4. Anticipated return to work: 4 weeks.for 2 hr GTT for GDM f/u 5. Follow up: 4 weeks. Patient ID: Joanne [name], female   DOB: Jun 25, 1980, 38 y.o.   MRN: [id number]

## 2018-12-15 NOTE — Patient Instructions (Signed)
Parto por cesrea, cuidados posteriores Cesarean Delivery, Care After Esta hoja le brinda informacin sobre cmo cuidarse despus del procedimiento. El mdico tambin podr darle indicaciones ms especficas. Comunquese con el mdico si tiene problemas o preguntas. Qu puedo esperar despus del procedimiento? Despus del procedimiento, es comn tener los siguientes sntomas:  Una pequea cantidad de sangre o de lquido transparente que proviene de la incisin.  Enrojecimiento, hinchazn y dolor en la zona de la incisin.  Dolor y molestia abdominales.  Hemorragia vaginal (loquios). Aunque no haya tenido parto vaginal, tendr sangrado y secrecin vaginal.  Calambres plvicos.  Fatiga. Es posible que sienta dolor, hinchazn y molestias en el tejido que se encuentra entre la vagina y el ano (perineo) en los siguientes casos:  Si la cesrea no fue planificada y le permitieron realizar el trabajo de parto y pujar.  Si le hicieron una incisin en la zona (episiotoma) o el tejido se desgarr durante el intento de parto vaginal. Siga estas indicaciones en su casa: Cuidados de la incisin   Siga las indicaciones del mdico acerca del cuidado de la incisin. Asegrese de hacer lo siguiente: ? Lvese las manos con agua y jabn antes de cambiar las vendas (vendaje). Use desinfectante para manos si no dispone de agua y jabn. ? Si tiene un vendaje, cmbielo o quteselo siguiendo las indicaciones del mdico. ? No retire los puntos (suturas), las grapas cutneas, la goma para cerrar la piel o las tiras adhesivas. Es posible que estos cierres cutneos deban permanecer en la piel durante 2semanas o ms tiempo. Si los bordes de las tiras adhesivas empiezan a despegarse y enroscarse, puede recortar los que estn sueltos. No retire las tiras adhesivas por completo a menos que el mdico se lo indique.  Controle todos los das la zona de la incisin para detectar signos de infeccin. Est atento a los  siguientes signos: ? Aumento del enrojecimiento, la hinchazn o el dolor. ? Ms lquido o sangre. ? Calor. ? Pus o mal olor.  No tome baos de inmersin, no nade ni use un jacuzzi hasta que el mdico la autorice. Pregntele al mdico si puede ducharse.  Cuando tosa o estornude, abrace una almohada. Esto ayuda con el dolor y disminuye la posibilidad de que su incisin se abra (dehiscencia). Haga esto hasta que la incisin cicatrice. Medicamentos  Tome los medicamentos de venta libre y los recetados solamente como se lo haya indicado el mdico.  Si le recetaron un antibitico, tmelo como se lo haya indicado el mdico. No deje de tomar el antibitico aunque comience a sentirse mejor.  No conduzca ni use maquinaria pesada mientras toma analgsicos recetados. Estilo de vida  No beba alcohol. Esto es de suma importancia si est amamantando o toma analgsicos.  No consuma ningn producto que contenga nicotina o tabaco, como cigarrillos, cigarrillos electrnicos y tabaco de mascar. Si necesita ayuda para dejar de fumar, consulte al mdico. Comida y bebida  Beba al menos 8vasos de ochoonzas (240cc) de agua todos los das a menos que el mdico le indique lo contrario. Si amamanta, quiz deba beber an ms cantidad de agua.  Coma alimentos ricos en fibras todos los das. Estos alimentos pueden ayudar a prevenir o aliviar el estreimiento. Los alimentos ricos en fibra incluyen los siguientes: ? Panes y cereales integrales. ? Arroz integral. ? Frijoles. ? Frutas y verduras frescas. Actividad   Si es posible, pdale a alguien que la ayude a cuidar del beb y con las tareas del hogar durante al   menos algunos das despus de que le den el alta del hospital.  Retome sus actividades normales como se lo haya indicado el mdico. Pregntele al mdico qu actividades son seguras para usted.  Descanse todo lo que pueda. Trate de descansar o tomar una siesta mientras el beb duerme.  No levante  ningn objeto que pese ms de 10libras (4,5kg) o el lmite de peso que le hayan indicado, hasta que el mdico le diga que puede [place].  Hable con el mdico sobre cundo puede retomar la actividad sexual. Esto puede depender de lo siguiente: ? Riesgo de sufrir una infeccin. ? La rapidez con que cicatrice. ? Comodidad y deseo de [employer] sexual. Indicaciones generales  No use tampones ni se haga duchas vaginales hasta que el mdico la autorice.  Use ropa floja y cmoda y un sostn firme y Nurse, adult.  Mantenga el perineo limpio y seco. Cuando vaya al bao, siempre higiencese de adelante hacia atrs.  Si expulsa un cogulo de sangre, gurdelo y llame al mdico para informrselo. No deseche los cogulos de sangre por el inodoro antes de recibir indicaciones del mdico.  Consulting civil engineer a todas las visitas de seguimiento para usted y Photographer beb, como se lo haya indicado el mdico. Esto es importante. Comunquese con un mdico si:  Tiene los siguientes sntomas: ? [place]. ? Secrecin vaginal con mal olor. ? Pus o mal olor en TEFL teacher de la incisin. ? Dificultad o dolor al [employer]. ? Aumento o disminucin repentinos de la frecuencia de las deposiciones. ? Aumento del enrojecimiento, la hinchazn o el dolor alrededor de la incisin. ? Aumento del lquido o sangre proveniente de la incisin. ? Erupcin cutnea. ? Nuseas. ? Poco inters o falta de inters en actividades que solan gustarle. ? Dudas sobre su cuidado y el del beb.  Su incisin se siente caliente al tacto.  Siente dolor en las mamas y se ponen rojas o duras.  Siente tristeza o preocupacin de forma inusual.  Vomita.  Elimina un cogulo de sangre grande por la vagina.  Orina ms de lo habitual.  Se siente mareado o aturdido. Solicite ayuda inmediatamente si:  Tiene los siguientes sntomas: ? Dolor que no desaparece o no mejora con medicamentos. ? Tourist information centre manager. ? Dificultad para respirar. ?  Visin borrosa o manchas en la visin. ? Pensamientos de autolesionarse o lesionar al beb. ? Primary school teacher abdomen o en una de las piernas. ? Dolor de cabeza intenso.  Se desmaya.  Tiene una hemorragia tan intensa en la vagina que empapa ms de un apsito en [employer]. El sangrado no debe ser ms abundante que el perodo ms intenso que haya tenido. Resumen  Despus del procedimiento, es comn Theatre stage manager de la incisin, clicos abdominales, y sangrado vaginal leve.  [place] zona de la incisin para detectar signos de infeccin.  Informe al mdico sobre cualquier sntoma inusual.  Concurra a todas las visitas de seguimiento para usted y el beb, como se lo haya indicado el mdico. Esta informacin no tiene Marine scientist el consejo del mdico. Asegrese de hacerle al mdico cualquier pregunta que tenga. Document Released: 02/03/2005 Document Revised: 09/16/2017 Document Reviewed: 09/16/2017 Elsevier Patient Education  2020 [employer].

## 2018-12-15 NOTE — Progress Notes (Signed)
2 week Post Partum Visit today for Incision check.  Post Partum visit scheduled 12/28/18.  CC: pt just notes right sided pain and pain with urination.

## 2018-12-28 ENCOUNTER — Other Ambulatory Visit: Payer: Self-pay

## 2018-12-28 ENCOUNTER — Ambulatory Visit (INDEPENDENT_AMBULATORY_CARE_PROVIDER_SITE_OTHER): Payer: Self-pay | Admitting: Obstetrics and Gynecology

## 2018-12-28 DIAGNOSIS — Z9889 Other specified postprocedural states: Secondary | ICD-10-CM

## 2018-12-28 DIAGNOSIS — E039 Hypothyroidism, unspecified: Secondary | ICD-10-CM

## 2018-12-28 DIAGNOSIS — O24410 Gestational diabetes mellitus in pregnancy, diet controlled: Secondary | ICD-10-CM

## 2018-12-28 NOTE — Progress Notes (Signed)
Obstetrics/Postpartum Visit  Appointment Date: 12/28/2018  OBGYN Clinic: [facility]  Primary Care Provider: Juluis Mire [name]  Chief Complaint:  Chief Complaint  Patient presents with  . Postpartum Care    History of Present Illness: Joanne [name] is a 38 y.o. Hispanic [phone] (No LMP recorded.), seen for the above chief complaint. 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  She is s/[name] emergent repeat c-section and cystoscopy on 11/30/18 at 37 weeks for non-reassuring fetal status; she was discharged to home on POD#2. Pregnancy complicated by gDMA1.  Complains of some soreness in the area of her incision.   Vaginal bleeding or discharge: No  Breast or formula feeding: breast Intercourse: No  Contraception: vasectomy PP depression s/s: No  Any bowel or bladder issues: No  Pap smear: needs pap, to be done at [facility]  Review of Systems: Positive for n/a.   Her 12 point review of systems is negative or as noted in the History of Present Illness.  Patient Active Problem List   Diagnosis Date Noted  . H/O cesarean section 11/30/2018  . Gestational diabetes mellitus, class A1 10/02/2018  . 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 Hypothyroidism complicating pregnancy 45/40/9811    Medications Joanne [name] had no medications administered during this visit. Current Outpatient Medications  Medication Sig Dispense Refill  . levothyroxine (SYNTHROID, LEVOTHROID) 75 MCG tablet Take 1 tablet (75 mcg total) by mouth daily. 90 tablet 3  . cyclobenzaprine (FLEXERIL) 10 MG tablet Take 1 tablet (10 mg total) by mouth every 8 (eight) hours as needed for muscle spasms. (Patient not taking: Reported on 11/25/2018) 30 tablet 1  . Elastic Bandages & Supports (COMFORT FIT MATERNITY SUPP SM) MISC 1 Units by Does not apply route daily as needed. (Patient not taking: Reported on 12/15/2018) 1  each 0  . ferrous sulfate 325 (65 FE) MG EC tablet Take 1 tablet (325 mg total) by mouth 2 (two) times daily with a meal. (Patient not taking: Reported on 11/25/2018) 60 tablet 4  . Magnesium 200 MG TABS Take 2 tablets (400 mg total) by mouth at bedtime. (Patient not taking: Reported on 11/25/2018) 30 each 2  . oxyCODONE (OXY IR/ROXICODONE) 5 MG immediate release tablet Take 1-2 tablets (5-10 mg total) by mouth every 4 (four) hours as needed for moderate pain. (Patient not taking: Reported on 12/15/2018) 20 tablet 0  . promethazine (PHENERGAN) 25 MG tablet Take 1 tablet (25 mg total) by mouth every 6 (six) hours as needed for nausea or vomiting. (Patient not taking: Reported on 11/25/2018) 30 tablet 1   No current facility-administered medications for this visit.    Allergies Nsaids  Physical Exam:  BP (!) 92/59   Pulse (!) 56   Wt 142 lb (64.4 kg)   BMI 26.83 kg/m  Body mass index is 26.83 kg/m. General appearance: Well nourished, well developed female in no acute distress.  Cardiovascular: regular rate and rhythm Respiratory:  Normal respiratory effort Abdomen: no masses, hernias; diffusely non tender to palpation, non distended, well healed pfannenstiel incision Breasts: not examined. Neuro/Psych:  Normal mood and affect.  Skin:  Warm and dry.    PP Depression Screening:   Edinburgh Postnatal Depression Scale - 12/28/18 1004      Edinburgh Postnatal Depression Scale:  In the Past 7 Days   I have been able to laugh and see the funny side of things.  0  I have looked forward with enjoyment to things.  0    I have blamed myself unnecessarily when things went wrong.  1    I have been anxious or worried for no good reason.  0    I have felt scared or panicky for no good reason.  0    Things have been getting on top of me.  0    I have been so unhappy that I have had difficulty sleeping.  0    I have felt sad or miserable.  0    I have been so unhappy that I have been crying.  0     The thought of harming myself has occurred to me.  0    Edinburgh Postnatal Depression Scale Total  1      Assessment: Patient is a 38 y.o. [id number] who is 4 weeks post partum from a emergent repeat c-section. She is doing well.   Plan:   1. Postpartum state Doing well  2. Gestational diabetes mellitus, class A1 Needs 2 hr postpartum GTT  3. Hypothyroidism affecting pregnancy, antepartum TSH today  4. Postoperative state Doing well   RTC 2 weeks for post partum GTT   K. Therese Sarah, M.D. Attending [facility] for [employer] [facility])

## 2018-12-28 NOTE — Progress Notes (Signed)
PP visit, delivery via c/s at 37 weeks. Pt is approx 4 weeks since delivery.  Pt states she is doing well, having some mild pain. Pt is not needing to take any pain medication.  Pt states light bleeding, denies urinary/bowel problems. Pt is breast feeding, doing well.  Pt husband is planning vasectomy, in process now.  Pt is not currently sexually active.  EPDS = 1 today.

## 2018-12-29 LAB — TSH: TSH: 0.4 u[IU]/mL — ABNORMAL LOW (ref 0.450–4.500)

## 2018-12-29 LAB — T4, FREE: Free T4: 1.41 ng/dL (ref 0.82–1.77)

## 2019-01-11 ENCOUNTER — Other Ambulatory Visit: Payer: Self-pay

## 2019-01-21 ENCOUNTER — Other Ambulatory Visit: Payer: Self-pay

## 2019-01-22 LAB — GLUCOSE TOLERANCE, 2 HOURS
Glucose, 2 hour: 107 mg/dL (ref 65–139)
Glucose, GTT - Fasting: 88 mg/dL (ref 65–99)

## 2020-03-08 ENCOUNTER — Encounter: Payer: Self-pay | Admitting: Obstetrics

## 2020-03-08 ENCOUNTER — Other Ambulatory Visit (HOSPITAL_COMMUNITY)
Admission: RE | Admit: 2020-03-08 | Discharge: 2020-03-08 | Disposition: A | Discharge status: Home / Self Care | Payer: Self-pay | Source: Ambulatory Visit | Attending: Obstetrics | Admitting: Obstetrics

## 2020-03-08 ENCOUNTER — Other Ambulatory Visit: Payer: Self-pay

## 2020-03-08 ENCOUNTER — Ambulatory Visit (INDEPENDENT_AMBULATORY_CARE_PROVIDER_SITE_OTHER): Payer: Self-pay | Admitting: Obstetrics

## 2020-03-08 VITALS — BP 97/57 | HR 65 | Wt 144.7 lb

## 2020-03-08 DIAGNOSIS — Z124 Encounter for screening for malignant neoplasm of cervix: Secondary | ICD-10-CM

## 2020-03-08 DIAGNOSIS — Z3490 Encounter for supervision of normal pregnancy, unspecified, unspecified trimester: Secondary | ICD-10-CM | POA: Insufficient documentation

## 2020-03-08 DIAGNOSIS — O09529 Supervision of elderly multigravida, unspecified trimester: Secondary | ICD-10-CM

## 2020-03-08 DIAGNOSIS — O0990 Supervision of high risk pregnancy, unspecified, unspecified trimester: Secondary | ICD-10-CM

## 2020-03-08 DIAGNOSIS — E039 Hypothyroidism, unspecified: Secondary | ICD-10-CM

## 2020-03-08 DIAGNOSIS — Z789 Other specified health status: Secondary | ICD-10-CM

## 2020-03-08 DIAGNOSIS — O99280 Endocrine, nutritional and metabolic diseases complicating pregnancy, unspecified trimester: Secondary | ICD-10-CM

## 2020-03-08 MED ORDER — PRENATAL PLUS 27-1 MG PO TABS: 1.0000 | ORAL_TABLET | Freq: Every day | ORAL | 3 refills | Status: AC

## 2020-03-08 MED ORDER — LEVOTHYROXINE SODIUM 75 MCG PO TABS: 75.0000 ug | ORAL_TABLET | Freq: Every day | ORAL | 3 refills | Status: AC

## 2020-03-08 MED ORDER — FERROUS SULFATE 325 (65 FE) MG PO TABS: 325.0000 mg | ORAL_TABLET | ORAL | 3 refills | Status: DC

## 2020-03-08 NOTE — Addendum Note (Signed)
Addended by: Coral Ceo A on: 03/08/2020 04:11 PM   Modules accepted: Orders

## 2020-03-08 NOTE — Progress Notes (Addendum)
Subjective:    Joanne [name] is being seen today for her first obstetrical visit.  This is not a planned pregnancy. She is at [redacted]w[redacted]d gestation. Her obstetrical history is significant for advanced maternal age. Relationship with FOB: spouse, living together. Patient does intend to breast feed. Pregnancy history fully reviewed.  The information documented in the HPI was reviewed and verified.  Menstrual History: OB History    Gravida  4   Para  3   Term  3   Preterm      AB      Living  3     SAB      IAB      Ectopic      Multiple  0   Live Births  3            Patient's last menstrual period was 11/21/2019.    Past Medical History:  Diagnosis Date  . Chronic kidney disease    Pt has only one kidney, pt. donated a kidney. Pt. not having any problems  . Hypothyroid   . Hypothyroidism   . Postpartum hemorrhage 09/22/2014   EBL 851   . VBAC (vaginal birth after Cesarean) 09/22/2014    Past Surgical History:  Procedure Laterality Date  . CESAREAN SECTION  12/23/2010   Procedure: CESAREAN SECTION;  Surgeon: Kathreen Cosier, MD;  Location: [facility];  Service: Gynecology;  Laterality: N/A;  . CESAREAN SECTION N/A 11/30/2018   Procedure: CESAREAN SECTION;  Surgeon: Conan Bowens, MD;  Location: [facility];  Service: Obstetrics;  Laterality: N/A;  . CYSTOSCOPY Bilateral 11/30/2018   Procedure: CYSTOSCOPY;  Surgeon: Conan Bowens, MD;  Location: [facility];  Service: Obstetrics;  Laterality: Bilateral;  . donated kidney      (Not in a hospital admission)  Allergies  Allergen Reactions  . Nsaids Other (See Comments)    Patient with one kidney, avoid NSAIDS    Social History   Tobacco Use  . Smoking status: Never Smoker  . Smokeless tobacco: Never Used  Substance Use Topics  . Alcohol use: No    Family History  Problem Relation Age of Onset  . Diabetes Maternal Aunt      Review of Systems Constitutional: negative for weight loss Gastrointestinal:  negative for vomiting Genitourinary:negative for genital lesions and vaginal discharge and dysuria Musculoskeletal:negative for back pain Behavioral/Psych: negative for abusive relationship, depression, illegal drug usage and tobacco use    Objective:    BP (!) 97/57   Pulse 65   Wt 144 lb 11.2 oz (65.6 kg)   LMP 11/21/2019   BMI 27.34 kg/m  General Appearance:    Alert, cooperative, no distress, appears stated age  Head:    Normocephalic, without obvious abnormality, atraumatic  Eyes:    PERRL, conjunctiva/corneas clear, EOM's intact, fundi    benign, both eyes  Ears:    Normal TM's and external ear canals, both ears  Nose:   Nares normal, septum midline, mucosa normal, no drainage    or sinus tenderness  Throat:   Lips, mucosa, and tongue normal; teeth and gums normal  Neck:   Supple, symmetrical, trachea midline, no adenopathy;    thyroid:  no enlargement/tenderness/nodules; no carotid   bruit or JVD  Back:     Symmetric, no curvature, ROM normal, no CVA tenderness  Lungs:     Clear to auscultation bilaterally, respirations unlabored  Chest Wall:    No tenderness or deformity   Heart:  Regular rate and rhythm, S1 and S2 normal, no murmur, rub   or gallop  Breast Exam:    No tenderness, masses, or nipple abnormality  Abdomen:     Soft, non-tender, bowel sounds active all four quadrants,    no masses, no organomegaly  Genitalia:    Normal female without lesion, discharge or tenderness  Extremities:   Extremities normal, atraumatic, no cyanosis or edema  Pulses:   2+ and symmetric all extremities  Skin:   Skin color, texture, turgor normal, no rashes or lesions  Lymph nodes:   Cervical, supraclavicular, and axillary nodes normal  Neurologic:   CNII-XII intact, normal strength, sensation and reflexes    throughout      Lab Review Urine pregnancy test Labs reviewed yes Radiologic studies reviewed no  Assessment:    Pregnancy at [redacted]w[redacted]d weeks    Plan:     1. Supervision  of high risk pregnancy, antepartum Rx: - prenatal vitamin w/FE, FA (PRENATAL 1 + 1) 27-1 MG TABS tablet; Take 1 tablet by mouth daily before breakfast.  Dispense: 90 tablet; Refill: 3 - ferrous sulfate 325 (65 FE) MG tablet; Take 1 tablet (325 mg total) by mouth every other day.  Dispense: 90 tablet; Refill: 3 - CBC/D/Plt+RPR+Rh+ABO+Rub Ab... - AFP, Serum, Open Spina Bifida  2. Antepartum multigravida of advanced maternal age Rx: - Culture, OB Urine - Cervicovaginal ancillary only( [place])  3. Hypothyroidism affecting pregnancy, antepartum Rx: - TSH - levothyroxine (SYNTHROID) 75 MCG tablet; Take 1 tablet (75 mcg total) by mouth daily.  Dispense: 90 tablet; Refill: 3 - Ferritin  4. Screening for cervical cancer Rx: - Cytology - PAP( )  5. Language barrier affecting health care - interpreter needed for prenatal visits   Prenatal vitamins.  Counseling provided regarding continued use of seat belts, cessation of alcohol consumption, smoking or use of illicit drugs; infection precautions i.e., influenza/TDAP immunizations, toxoplasmosis,CMV, parvovirus, listeria and varicella; workplace safety, exercise during pregnancy; routine dental care, safe medications, sexual activity, hot tubs, saunas, pools, travel, caffeine use, fish and methlymercury, potential toxins, hair treatments, varicose veins Weight gain recommendations per IOM guidelines reviewed: underweight/BMI< 18.5--> gain 28 - 40 lbs; normal weight/BMI 18.5 - 24.9--> gain 25 - 35 lbs; overweight/BMI 25 - 29.9--> gain 15 - 25 lbs; obese/BMI >30->gain  11 - 20 lbs Problem list reviewed and updated. FIRST/CF mutation testing/NIPT/QUAD SCREEN/fragile X/Ashkenazi Jewish population testing/Spinal muscular atrophy discussed: declined. Role of ultrasound in pregnancy discussed; fetal survey: requested. Amniocentesis discussed: not indicated.  Meds ordered this encounter  Medications  . levothyroxine (SYNTHROID) 75 MCG  tablet    Sig: Take 1 tablet (75 mcg total) by mouth daily.    Dispense:  90 tablet    Refill:  3  . prenatal vitamin w/FE, FA (PRENATAL 1 + 1) 27-1 MG TABS tablet    Sig: Take 1 tablet by mouth daily before breakfast.    Dispense:  90 tablet    Refill:  3  . ferrous sulfate 325 (65 FE) MG tablet    Sig: Take 1 tablet (325 mg total) by mouth every other day.    Dispense:  90 tablet    Refill:  3   Orders Placed This Encounter  Procedures  . Culture, OB Urine  . TSH    Hypothyroidism  . CBC/D/Plt+RPR+Rh+ABO+Rub Ab...  . AFP, Serum, Open Spina Bifida    Order Specific Question:   Is patient insulin dependent?    Answer:   No  Order Specific Question:   Patient weight (lb.)    Answer:   144 lb 11.2 oz (65.6 kg)    Order Specific Question:   Gestational Age (GA), weeks    Answer:   27    Order Specific Question:   Date on which patient was at this GA    Answer:   03/08/2020    Order Specific Question:   GA Calculation Method    Answer:   LMP    Order Specific Question:   Number of fetuses    Answer:   1    Order Specific Question:   Donor egg?    Answer:   N    Follow up in 4 weeks. 50% of 20 min visit spent on counseling and coordination of care.    Brock Bad, MD 03/08/2020 3:10 PM

## 2020-03-08 NOTE — Progress Notes (Signed)
NOB [redacted]w[redacted]d  Planned Pregnancy: no   Genetic Screening: Declined  Pt made aware of cost and compassion care form.   Last Pap: 2019 WNL  Pt made aware [facility] will provide letter if needed.  Pt only received 1st dose of Covid Vaccine.   CC: Pressure in upper abdominal area and at night she has discomfort.

## 2020-03-08 NOTE — Addendum Note (Signed)
Addended by: Brock Bad on: 03/08/2020 03:49 PM   Modules accepted: Orders

## 2020-03-09 LAB — CYTOLOGY - PAP
Comment: NEGATIVE
Diagnosis: NEGATIVE
High risk HPV: NEGATIVE

## 2020-03-09 LAB — CERVICOVAGINAL ANCILLARY ONLY
Bacterial Vaginitis (gardnerella): NEGATIVE
Candida Glabrata: NEGATIVE
Candida Vaginitis: NEGATIVE
Chlamydia: NEGATIVE
Comment: NEGATIVE
Comment: NEGATIVE
Comment: NEGATIVE
Comment: NEGATIVE
Comment: NEGATIVE
Comment: NORMAL
Neisseria Gonorrhea: NEGATIVE
Trichomonas: NEGATIVE

## 2020-03-10 LAB — URINE CULTURE, OB REFLEX

## 2020-03-10 LAB — CULTURE, OB URINE

## 2020-03-11 LAB — CBC/D/PLT+RPR+RH+ABO+RUB AB...
Antibody Screen: NEGATIVE
Basophils Absolute: 0 10*3/uL (ref 0.0–0.2)
Basos: 1 %
EOS (ABSOLUTE): 0.1 10*3/uL (ref 0.0–0.4)
Eos: 1 %
HCV Ab: <0.1 s/co ratio (ref 0.0–0.9)
HIV Screen 4th Generation wRfx: NONREACTIVE
Hematocrit: 37.2 % (ref 34.0–46.6)
Hemoglobin: 12.6 g/dL (ref 11.1–15.9)
Hepatitis B Surface Ag: NEGATIVE
Immature Grans (Abs): 0 10*3/uL (ref 0.0–0.1)
Immature Granulocytes: 0 %
Lymphocytes Absolute: 1.5 10*3/uL (ref 0.7–3.1)
Lymphs: 22 %
MCH: 29 pg (ref 26.6–33.0)
MCHC: 33.9 g/dL (ref 31.5–35.7)
MCV: 86 fL (ref 79–97)
Monocytes Absolute: 0.4 10*3/uL (ref 0.1–0.9)
Monocytes: 5 %
Neutrophils Absolute: 4.8 10*3/uL (ref 1.4–7.0)
Neutrophils: 71 %
Platelets: 275 10*3/uL (ref 150–450)
RBC: 4.34 x10E6/uL (ref 3.77–5.28)
RDW: 13.7 % (ref 11.7–15.4)
RPR Ser Ql: REACTIVE — AB
Rh Factor: POSITIVE
Rubella Antibodies, IGG: 17.8 index (ref >0.99)
WBC: 6.8 10*3/uL (ref 3.4–10.8)

## 2020-03-11 LAB — AFP, SERUM, OPEN SPINA BIFIDA
AFP MoM: 1.23
AFP Value: 37.3 ng/mL
Gest. Age on Collection Date: 15 weeks
Maternal Age At EDD: 39.1 yr
OSBR Risk 1 IN: 5926
Test Results:: NEGATIVE
Weight: 144 [lb_av]

## 2020-03-11 LAB — RPR, QUANT+TP ABS (REFLEX)
Rapid Plasma Reagin, Quant: 1:4 {titer} — ABNORMAL HIGH
T Pallidum Abs: NONREACTIVE

## 2020-03-11 LAB — HCV INTERPRETATION

## 2020-03-11 LAB — TSH: TSH: 7.42 u[IU]/mL — ABNORMAL HIGH (ref 0.450–4.500)

## 2020-03-12 ENCOUNTER — Other Ambulatory Visit: Payer: Self-pay | Admitting: Obstetrics

## 2020-03-12 DIAGNOSIS — E039 Hypothyroidism, unspecified: Secondary | ICD-10-CM

## 2020-03-12 DIAGNOSIS — O99282 Endocrine, nutritional and metabolic diseases complicating pregnancy, second trimester: Secondary | ICD-10-CM

## 2020-03-13 ENCOUNTER — Encounter: Payer: Self-pay | Admitting: Family Medicine

## 2020-03-14 ENCOUNTER — Other Ambulatory Visit: Payer: Self-pay | Admitting: Obstetrics

## 2020-03-14 DIAGNOSIS — E039 Hypothyroidism, unspecified: Secondary | ICD-10-CM

## 2020-03-14 DIAGNOSIS — O99282 Endocrine, nutritional and metabolic diseases complicating pregnancy, second trimester: Secondary | ICD-10-CM

## 2020-04-04 ENCOUNTER — Ambulatory Visit (INDEPENDENT_AMBULATORY_CARE_PROVIDER_SITE_OTHER): Payer: Self-pay | Admitting: Obstetrics and Gynecology

## 2020-04-04 ENCOUNTER — Encounter: Payer: Self-pay | Admitting: Obstetrics and Gynecology

## 2020-04-04 ENCOUNTER — Other Ambulatory Visit: Payer: Self-pay

## 2020-04-04 VITALS — BP 92/55 | HR 67 | Wt 145.0 lb

## 2020-04-04 DIAGNOSIS — Z148 Genetic carrier of other disease: Secondary | ICD-10-CM

## 2020-04-04 DIAGNOSIS — E039 Hypothyroidism, unspecified: Secondary | ICD-10-CM

## 2020-04-04 DIAGNOSIS — Z524 Kidney donor: Secondary | ICD-10-CM

## 2020-04-04 DIAGNOSIS — Z98891 History of uterine scar from previous surgery: Secondary | ICD-10-CM

## 2020-04-04 DIAGNOSIS — O99282 Endocrine, nutritional and metabolic diseases complicating pregnancy, second trimester: Secondary | ICD-10-CM

## 2020-04-04 DIAGNOSIS — Z3480 Encounter for supervision of other normal pregnancy, unspecified trimester: Secondary | ICD-10-CM

## 2020-04-04 NOTE — Progress Notes (Signed)
Pt would like to discuss having a Tubal along with her c/s.

## 2020-04-04 NOTE — Progress Notes (Signed)
Subjective:  Joanne [name] is a 40 y.o. G4P3003 at [redacted]w[redacted]d being seen today for ongoing prenatal care.  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  Patient reports no complaints.  Contractions: Not present. Vag. Bleeding: None.  Movement: Present. Denies leaking of fluid.   The following portions of the patient's history were reviewed and updated as appropriate: allergies, current medications, past family history, past medical history, past social history, past surgical history and problem list. Problem list updated.  Objective:   Vitals:   04/04/20 0931  BP: (!) 92/55  Pulse: 67  Weight: 145 lb (65.8 kg)    Fetal Status: Fetal Heart Rate (bpm): 150   Movement: Present     General:  Alert, oriented and cooperative. Patient is in no acute distress.  Skin: Skin is warm and dry. No rash noted.   Cardiovascular: Normal heart rate noted  Respiratory: Normal respiratory effort, no problems with respiration noted  Abdomen: Soft, gravid, appropriate for gestational age. Pain/Pressure: Absent     Pelvic:  Cervical exam deferred        Extremities: Normal range of motion.     Mental Status: Normal mood and affect. Normal behavior. Normal judgment and thought content.   Urinalysis:      Assessment and Plan:  Pregnancy: G4P3003 at [redacted]w[redacted]d  1. Supervision of other normal pregnancy, antepartum Stable Anatomy scan with [facility] last week, will obtain copy  2. Hypothyroidism affecting pregnancy in second trimester Stable, taking Synthroid Recheck TSH at 28 week labs  3. H/O cesarean section For repeat with BTL at 39 weeks  4. 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  5. Kidney donor Stable  Preterm labor  symptoms and general obstetric precautions including but not limited to vaginal bleeding, contractions, leaking of fluid and fetal movement were reviewed in detail with the patient. Please refer to After Visit Summary for other counseling recommendations.  Return in about 4 weeks (around 05/02/2020) for face to face, any provider.   Hermina Staggers, MD

## 2020-04-04 NOTE — Patient Instructions (Signed)
Segundo trimestre de embarazo Second Trimester of Pregnancy  El segundo trimestre de embarazo va desde la semana 13 hasta la semana 27. Tambin se dice que va desde el mes 4 hasta el mes 6 de embarazo. Este suele ser el momento en el que mejor se siente. Durante el segundo trimestre:  Las nuseas del embarazo han disminuido o han desaparecido.  Usted puede tener ms energa.  Usted puede tener hambre con ms frecuencia. En esta poca, el beb en gestacin (feto) crece muy rpido. Hacia el final del sexto mes, el beb en gestacin puede medir aproximadamente 12 pulgadas y pesar alrededor de 1 libras. Es probable que comience a sentir que el beb se mueve entre las 16 y las 20 semanas de embarazo. Cambios en el cuerpo durante el segundo trimestre Su organismo contina atravesando por muchos cambios durante este perodo. Los cambios varan y generalmente vuelven a la normalidad despus del nacimiento del beb. Cambios fsicos  Aumentar ms peso.  Podrn aparecer las primeras estras en las caderas, el vientre (abdomen) y las mamas.  Las mamas crecern y pueden doler.  Pueden aparecer zonas oscuras o manchas en el rostro.  Es posible que se forme una lnea oscura desde el ombligo hasta la zona del pubis (linea nigra).  Tal vez haya cambios en el cabello. Cambios en la salud  Es posible que tenga dolores de cabeza.  Es posible que tenga acidez estomacal.  Es posible que tenga dificultades para defecar (estreimiento).  Es posible que tenga hemorroides o venas abultadas e hinchadas (venas varicosas).  Las encas pueden sangrarle.  Es posible que haga pis (orine) con mayor frecuencia.  Puede sentir dolor en la espalda. Siga estas instrucciones en su casa: Medicamentos  Use los medicamentos de venta libre y los recetados solamente como se lo haya indicado el mdico. Algunos medicamentos no son seguros durante el embarazo.  Tome vitaminas prenatales que contengan por lo menos  600microgramos (mcg) de cido flico. Comida y bebida  Consuma comidas saludables que incluyan lo siguiente: ? Frutas y verduras frescas. ? Cereales integrales. ? Buenas fuentes de protenas, como carne, huevos y tofu. ? Productos lcteos con bajo contenido de grasa.  Evite la carne cruda y el jugo, la leche y el queso sin pasteurizar.  Es posible que deba tomar medidas para prevenir o tratar los problemas para defecar: ? Beber suficiente lquido para mantener el pis (orina) de color amarillo plido. ? Come alimentos ricos en fibra. Entre ellos, frijoles, cereales integrales y frutas y verduras frescas. ? Limitar los alimentos con alto contenido de grasa y azcar. Estos incluyen alimentos fritos o dulces. Actividad  Haga ejercicios solamente como se lo haya indicado el mdico. La mayora de las personas pueden realizar su actividad fsica habitual durante el embarazo. Intente realizar como mnimo 30minutos de actividad fsica por lo menos 5das a la semana.  Deje de hacer ejercicio si tiene dolor o clicos en el vientre o en la zona lumbar.  No haga ejercicio si hace demasiado calor, hay demasiada humedad o se encuentra en un lugar de mucha altura (altitud elevada).  Evite levantar pesos excesivos.  Si lo desea, puede continuar teniendo relaciones sexuales, a menos que el mdico le indique lo contrario. Alivio del dolor y del malestar  Use un sostn que le brinde buen soporte si le duelen las mamas.  Dese baos de asiento con agua tibia para aliviar el dolor o las molestias causadas por las hemorroides. Use una crema para las hemorroides si   el mdico la autoriza.  Descanse con las piernas levantadas (elevadas) si tiene calambres en las piernas o dolor en la parte baja de la espalda.  Si desarrolla venas abultadas en las piernas: ? Use medias de compresin segn las indicaciones de su mdico. ? Levante los pies durante 15minutos, 3 o 4veces por da. ? Limite la sal en sus  alimentos. Seguridad  Use el cinturn de seguridad en todo momento mientras vaya en auto.  Hable con el mdico si alguien le est haciendo dao o gritando mucho. Estilo de vida  No se d baos de inmersin en agua caliente, baos turcos ni saunas.  No se haga duchas vaginales. No use tampones ni toallas higinicas perfumadas.  Evite el contacto con las bandejas sanitarias de los gatos y la tierra que estos animales usan. Estos contienen grmenes que pueden daar al beb y causar la prdida del beb ya sea aborto espontneo o muerte fetal.  No consuma medicamentos a base de hierbas, drogas ilegales, ni medicamentos que el mdico no haya autorizado. No beba alcohol.  No fume ni consuma ningn producto que contenga nicotina o tabaco. Si necesita ayuda para dejar de fumar, consulte al mdico. Instrucciones generales  Cumpla con todas las visitas de seguimiento. Esto es importante.  Consulte a su mdico acerca de dnde se dictan clases prenatales cerca de donde vive.  Consulte a su mdico sobre los alimentos que debe comer o pdale que la ayude a encontrar a un asesor. Dnde buscar ms informacin  American Pregnancy Association (Asociacin Americana del Embarazo): americanpregnancy.org  American College of Obstetricians and Gynecologists (Colegio Estadounidense de Obstetras y Gineclogos): www.acog.org  Office on Women's Health (Oficina para la Salud de la Mujer): womenshealth.gov/pregnancy Comunquese con un mdico si:  Tiene un dolor de cabeza que no desaparece despus de tomar analgsicos.  Nota cambios en la visin o ve manchas delante de los ojos.  Tiene clicos o siente presin o dolor leves en la parte baja del vientre.  Sigue sintiendo como si fuera a vomitar (nuseas), vomita o hace deposiciones acuosas (diarrea).  Advierte lquido con mal olor que proviene de la vagina.  Siente dolor al orinar o hace orina con mal olor.  Tiene una gran hinchazn en la cara, las  manos, las piernas, los tobillos o los pies.  Tiene fiebre. Solicite ayuda de inmediato si:  Tiene una prdida de lquido por la vagina.  Tiene sangrado o pequeas prdidas vaginales.  Tiene clicos o dolor muy intensos en el vientre.  Tiene dificultad para respirar.  Sientes dolor en el pecho.  Se desmaya.  No ha sentido que el beb se moviera durante el perodo de tiempo que le dijo el mdico.  Tiene dolor, hinchazn o enrojecimiento nuevos en un brazo o una pierna o se produce un aumento de alguno de estos sntomas. Resumen  El segundo trimestre de embarazo va desde la semana 13 hasta la 27 (desde el mes 4 hasta el 6).  Consuma comidas saludables.  Haga ejercicios tal como le indic el mdico. La mayora de las personas pueden realizar su actividad fsica habitual durante el embarazo.  No consuma medicamentos a base de hierbas, drogas ilegales, ni medicamentos que el mdico no haya autorizado. No beba alcohol.  Llame al mdico si se enferma o si nota algo inusual acerca de su embarazo. Esta informacin no tiene como fin reemplazar el consejo del mdico. Asegrese de hacerle al mdico cualquier pregunta que tenga. Document Revised: 08/19/2019 Document Reviewed: 08/19/2019 Elsevier Patient Education    2021 Elsevier Inc.  

## 2020-05-02 ENCOUNTER — Encounter: Payer: Self-pay | Admitting: Obstetrics & Gynecology

## 2020-05-02 ENCOUNTER — Other Ambulatory Visit: Payer: Self-pay

## 2020-05-02 ENCOUNTER — Ambulatory Visit (INDEPENDENT_AMBULATORY_CARE_PROVIDER_SITE_OTHER): Payer: Self-pay | Admitting: Obstetrics & Gynecology

## 2020-05-02 VITALS — BP 96/56 | HR 71 | Wt 148.4 lb

## 2020-05-02 DIAGNOSIS — Z98891 History of uterine scar from previous surgery: Secondary | ICD-10-CM

## 2020-05-02 DIAGNOSIS — E039 Hypothyroidism, unspecified: Secondary | ICD-10-CM

## 2020-05-02 DIAGNOSIS — Z3480 Encounter for supervision of other normal pregnancy, unspecified trimester: Secondary | ICD-10-CM

## 2020-05-02 DIAGNOSIS — O99282 Endocrine, nutritional and metabolic diseases complicating pregnancy, second trimester: Secondary | ICD-10-CM

## 2020-05-02 NOTE — Progress Notes (Signed)
   PRENATAL VISIT NOTE  Subjective:  Joanne [name] is a 40 y.o. G4P3003 at [redacted]w[redacted]d being seen today for ongoing prenatal care.  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  Patient reports no complaints.  Contractions: Not present. Vag. Bleeding: None.  Movement: Present. Denies leaking of fluid.   The following portions of the patient's history were reviewed and updated as appropriate: allergies, current medications, past family history, past medical history, past social history, past surgical history and problem list.   Objective:   Vitals:   05/02/20 0835  BP: (!) 96/56  Pulse: 71  Weight: 148 lb 6.4 oz (67.3 kg)    Fetal Status: Fetal Heart Rate (bpm): 141   Movement: Present     General:  Alert, oriented and cooperative. Patient is in no acute distress.  Skin: Skin is warm and dry. No rash noted.   Cardiovascular: Normal heart rate noted  Respiratory: Normal respiratory effort, no problems with respiration noted  Abdomen: Soft, gravid, appropriate for gestational age.  Pain/Pressure: Absent     Pelvic: Cervical exam deferred        Extremities: Normal range of motion.  Edema: None  Mental Status: Normal mood and affect. Normal behavior. Normal judgment and thought content.   Assessment and Plan:  Pregnancy: G4P3003 at [redacted]w[redacted]d 1. Supervision of other normal pregnancy, antepartum discussed RCS vs.  TOLAC  Preterm labor symptoms and general obstetric precautions including but not limited to vaginal bleeding, contractions, leaking of fluid and fetal movement were reviewed in detail with the patient. Please refer to After Visit Summary for other counseling recommendations.   Return in about 4 weeks (around  05/30/2020) for 2hr.  Future Appointments  Date Time Provider Department Center  05/30/2020  8:15 AM [facility] None  05/30/2020  8:30 AM Warden Fillers, MD [facility] None    Scheryl Darter, MD

## 2020-05-02 NOTE — Patient Instructions (Signed)
Parto vaginal despus de un parto por cesrea Vaginal Birth After Cesarean Delivery  Un parto vaginal despus de un parto por cesrea (PVDC) es dar a luz por la vagina despus de haber dado a luz por medio de una intervencin quirrgica llamada cesrea. Es posible que el PVDC sea una alternativa segura para usted, segn su salud y otros factores. Es importante que hable con su mdico acerca del PVDC desde comienzos del embarazo de modo que pueda comprender los riesgos, beneficios y opciones. Hablar sobre estos temas desde el comienzo le permitir tener tiempo para planificar el parto. Quines son las mejores candidatas para tener un PVDC? Las mejores candidatas para tener un PVDC son las mujeres que cumplen con los siguientes requisitos:  Tuvieron uno o dos partos por cesrea anteriores, y las incisiones realizadas durante los partos fueron horizontales (transversales bajas).  No tienen una cicatriz uterina vertical (clsica).  No han tenido una ruptura en la pared del tero (ruptura uterina).  Piensan tener otros embarazos. Es ms probable que un PVDC se realice correctamente en los siguientes casos:  Mujeres que ya tuvieron partos vaginales antes.  Cuando el trabajo de parto comienza sin ninguna intervencin (de forma espontnea) antes de la fecha prevista. Cules son los beneficios del PVDC? Algunos de los beneficios de que el beb nazca por parto vaginal en lugar de por cesrea:  Estada ms corta en el hospital.  Menor tiempo de recuperacin.  Menos dolor.  Evitar los riesgos asociados a las cirugas mayores, como las infecciones y los cogulos de sangre.  Menor prdida de sangre y menor probabilidad de necesitar sangre de un donante (transfusin). Cules son los riesgos del PVDC? El principal riesgo de intentar tener un PVDC es que existe una posibilidad de que falle, lo que obligar al mdico a realizar una cesrea. Los otros riesgos son muy poco frecuentes y pueden ser  los siguientes:  Desgarro (ruptura) de la cicatriz de un parto por cesrea anterior.  Otros riesgos asociados con los partos vaginales. Si se debe repetir un parto por cesrea, los riesgos incluyen los siguientes:  Prdida de sangre.  Infeccin.  Cogulos de sangre.  Lesin en los rganos circundantes.  Extirpacin del tero (histerectoma), si se daa.  Problemas de placenta en embarazos futuros. Qu ms debo saber sobre las alternativas? El parto vaginal despus de una cesrea es similar a un parto espontneo vaginal normal. Por lo tanto, es seguro:  Intentarlo cuando se trata de gemelos.  Que el mdico intente girar al beb si se encuentra de nalgas (versin ceflica externa) durante el trabajo de parto.  Colocar anestesia epidural para aliviar el dolor. Considerar posibilidades sobre dnde le gustara que ocurriera el parto. El PVDC debe llevarse a cabo en centros donde se pueda realizar un parto por cesrea de urgencia. No se recomiendan los partos domiciliarios si planea tener un PVDC. Cualquier cambio en su salud o la de su beb durante el embarazo puede ser motivo de un cambio de decisin respecto del parto vaginal. El mdico podra recomendarle no realizar un PVDC en los siguientes casos:  El beb parece pesar 8.8lb (4kg) o ms.  Tiene preeclampsia. Esta es una afeccin que provoca el aumento de la presin arterial y otros sntomas, como hinchazn y dolores de cabeza.  Tendr un PVDC menos de 19meses despus de un parto por cesrea.  Ya pas la fecha prevista del parto.  Necesita que se inicie el trabajo de parto (induccin) porque el cuello uterino no est listo para   el parto (desfavorable). Dnde encontrar ms informacin  Asociacin Americana del Psychiatrist (American Pregnancy Association): bitchilla.com.  Congreso Estadounidense de Obstetras y Insurance claims handler of Obstetricians and Gynecologists): acog.org Resumen  Un parto vaginal  despus de un parto por cesrea (PVDC) es dar a luz por la vagina despus de haber dado a luz por medio de una intervencin quirrgica llamada cesrea. Es posible que el PVDC sea una alternativa segura para usted, segn su salud y Chief Technology Officer.  Hable con el mdico desde comienzos del embarazo para que pueda [employer], los beneficios y las Combs, y Warehouse manager mucho tiempo para Human resources officer.  El principal riesgo de Estate manager/land agent un PVDC es que existe una posibilidad de que [place], lo que obligar al mdico a Education officer, environmental una cesrea. Los otros riesgos son muy poco frecuentes. Esta informacin no tiene Theme park manager el consejo del mdico. Asegrese de hacerle al mdico cualquier pregunta que tenga. Document Revised: 09/09/2016 Document Reviewed: 09/09/2016 Elsevier Patient Education  2021 [employer].

## 2020-05-02 NOTE — Progress Notes (Signed)
Pt reports fetal movement, denies pain.  

## 2020-05-30 ENCOUNTER — Other Ambulatory Visit: Payer: Self-pay

## 2020-05-30 ENCOUNTER — Ambulatory Visit (INDEPENDENT_AMBULATORY_CARE_PROVIDER_SITE_OTHER): Payer: Self-pay | Admitting: Obstetrics and Gynecology

## 2020-05-30 VITALS — BP 105/67 | HR 83 | Wt 157.0 lb

## 2020-05-30 DIAGNOSIS — O4402 Placenta previa specified as without hemorrhage, second trimester: Secondary | ICD-10-CM | POA: Insufficient documentation

## 2020-05-30 DIAGNOSIS — Z98891 History of uterine scar from previous surgery: Secondary | ICD-10-CM

## 2020-05-30 DIAGNOSIS — E039 Hypothyroidism, unspecified: Secondary | ICD-10-CM

## 2020-05-30 DIAGNOSIS — Z524 Kidney donor: Secondary | ICD-10-CM

## 2020-05-30 DIAGNOSIS — O99282 Endocrine, nutritional and metabolic diseases complicating pregnancy, second trimester: Secondary | ICD-10-CM

## 2020-05-30 DIAGNOSIS — Z3480 Encounter for supervision of other normal pregnancy, unspecified trimester: Secondary | ICD-10-CM

## 2020-05-30 DIAGNOSIS — Z3A27 27 weeks gestation of pregnancy: Secondary | ICD-10-CM | POA: Insufficient documentation

## 2020-05-30 NOTE — Progress Notes (Signed)
Pt reports fetal movement, denies pain. Info provided on tdap vaccine.

## 2020-05-30 NOTE — Progress Notes (Signed)
   PRENATAL VISIT NOTE  Subjective:  Joanne [name] is a 40 y.o. G4P3003 at [redacted]w[redacted]d being seen today for ongoing prenatal care.  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  Patient doing well with no acute concerns today. She reports no complaints.  Contractions: Not present. Vag. Bleeding: None.  Movement: Present. Denies leaking of fluid.   [place] u/s received and reviewed.  Report showed IUGR 2 months ago as well as anterior placenta previa.  In light of 2 previous c sections and IUGR recommend MFM scan for further eval  Patient desires bilateral tubal sterilization.  Other reversible forms of contraception were discussed with patient; she declines all other modalities.  Discussed bilateral tubal sterilization in detail  Risks and benefits discussed in detail including but not limited to: risk of regret, permanence of method, bleeding, infection, injury to surrounding organs and need for additional procedures. Increased risk of ectopic gestation if pregnancy occurs was also discussed with patient.   Patient verbalized understanding of these risks and benefits and wants to proceed with sterilization .   The following portions of the patient's history were reviewed and updated as appropriate: allergies, current medications, past family history, past medical history, past social history, past surgical history and problem list. Problem list updated.  Objective:   Vitals:   05/30/20 0826  BP: 105/67  Pulse: 83  Weight: 157 lb (71.2 kg)    Fetal Status: Fetal Heart Rate (bpm): 140   Movement: Present     General:  Alert, oriented and  cooperative. Patient is in no acute distress.  Skin: Skin is warm and dry. No rash noted.   Cardiovascular: Normal heart rate noted  Respiratory: Normal respiratory effort, no problems with respiration noted  Abdomen: Soft, gravid, appropriate for gestational age.  Pain/Pressure: Absent     Pelvic: Cervical exam deferred        Extremities: Normal range of motion.  Edema: None  Mental Status:  Normal mood and affect. Normal behavior. Normal judgment and thought content.   Assessment and Plan:  Pregnancy: G4P3003 at [redacted]w[redacted]d  1. Supervision of other normal pregnancy, antepartum 28 week labs today - Glucose Tolerance, 2 Hours w/1 Hour - RPR - CBC - HIV antibody (with reflex)  2. Hypothyroidism affecting pregnancy in second trimester Recommend TSH at next visit  3. H/O cesarean section Pt desires repeat c/s with BTL, pt has no insurance, repeat c/s form signed,  4. [redacted] weeks gestation of pregnancy   5. Kidney donor   6. Placenta previa antepartum in second trimester eval with MFM - [place] MFM OB DETAIL +14 WK; Future  Preterm labor symptoms and general obstetric precautions including but not limited to vaginal bleeding, contractions, leaking of fluid and fetal movement were reviewed in detail with the patient.  Please refer to After Visit Summary for other counseling recommendations.   Return in about 2 weeks (around 06/13/2020) for [facility], in person.   Mariel Aloe, MD Faculty Attending [facility]

## 2020-05-31 LAB — CBC
Hematocrit: 31.7 % — ABNORMAL LOW (ref 34.0–46.6)
Hemoglobin: 10.4 g/dL — ABNORMAL LOW (ref 11.1–15.9)
MCH: 28.3 pg (ref 26.6–33.0)
MCHC: 32.8 g/dL (ref 31.5–35.7)
MCV: 86 fL (ref 79–97)
Platelets: 268 10*3/uL (ref 150–450)
RBC: 3.68 x10E6/uL — ABNORMAL LOW (ref 3.77–5.28)
RDW: 13.8 % (ref 11.7–15.4)
WBC: 7.7 10*3/uL (ref 3.4–10.8)

## 2020-05-31 LAB — GLUCOSE TOLERANCE, 2 HOURS W/ 1HR
Glucose, 1 hour: 187 mg/dL — ABNORMAL HIGH (ref 65–179)
Glucose, 2 hour: 145 mg/dL (ref 65–152)
Glucose, Fasting: 96 mg/dL — ABNORMAL HIGH (ref 65–91)

## 2020-05-31 LAB — RPR: RPR Ser Ql: NONREACTIVE

## 2020-05-31 LAB — HIV ANTIBODY (ROUTINE TESTING W REFLEX): HIV Screen 4th Generation wRfx: NONREACTIVE

## 2020-06-04 ENCOUNTER — Other Ambulatory Visit: Payer: Self-pay | Admitting: Obstetrics and Gynecology

## 2020-06-04 DIAGNOSIS — O24410 Gestational diabetes mellitus in pregnancy, diet controlled: Secondary | ICD-10-CM

## 2020-06-04 MED ORDER — ACCU-CHEK SOFTCLIX LANCETS MISC: 12 refills | Status: AC

## 2020-06-04 MED ORDER — GLUCOSE BLOOD VI STRP: ORAL_STRIP | 12 refills | Status: AC

## 2020-06-04 MED ORDER — ACCU-CHEK GUIDE W/DEVICE KIT: 1.0000 [IU] | PACK | Freq: Four times a day (QID) | 0 refills | Status: AC

## 2020-06-11 ENCOUNTER — Encounter: Payer: Self-pay | Admitting: Dietician

## 2020-06-13 ENCOUNTER — Other Ambulatory Visit: Payer: Self-pay

## 2020-06-13 ENCOUNTER — Encounter: Payer: Self-pay | Admitting: Obstetrics and Gynecology

## 2020-06-13 ENCOUNTER — Ambulatory Visit (INDEPENDENT_AMBULATORY_CARE_PROVIDER_SITE_OTHER): Payer: Self-pay | Admitting: Obstetrics and Gynecology

## 2020-06-13 VITALS — BP 101/61 | HR 81 | Wt 155.0 lb

## 2020-06-13 DIAGNOSIS — Z98891 History of uterine scar from previous surgery: Secondary | ICD-10-CM

## 2020-06-13 DIAGNOSIS — Z3480 Encounter for supervision of other normal pregnancy, unspecified trimester: Secondary | ICD-10-CM

## 2020-06-13 DIAGNOSIS — O24410 Gestational diabetes mellitus in pregnancy, diet controlled: Secondary | ICD-10-CM

## 2020-06-13 DIAGNOSIS — O24419 Gestational diabetes mellitus in pregnancy, unspecified control: Secondary | ICD-10-CM | POA: Insufficient documentation

## 2020-06-13 DIAGNOSIS — O99282 Endocrine, nutritional and metabolic diseases complicating pregnancy, second trimester: Secondary | ICD-10-CM

## 2020-06-13 DIAGNOSIS — E039 Hypothyroidism, unspecified: Secondary | ICD-10-CM

## 2020-06-13 DIAGNOSIS — O4402 Placenta previa specified as without hemorrhage, second trimester: Secondary | ICD-10-CM

## 2020-06-13 MED ORDER — ACCU-CHEK SOFTCLIX LANCETS MISC: 1.0000 | Freq: Four times a day (QID) | 12 refills | Status: AC

## 2020-06-13 MED ORDER — ACCU-CHEK GUIDE W/DEVICE KIT: 1.0000 | PACK | Freq: Four times a day (QID) | 0 refills | Status: AC

## 2020-06-13 MED ORDER — ACCU-CHEK GUIDE VI STRP: ORAL_STRIP | 12 refills | Status: AC

## 2020-06-13 NOTE — Patient Instructions (Signed)
Diabetes mellitus gestacional, diagnstico Gestational Diabetes Mellitus, Diagnosis La diabetes mellitus gestacional es una forma de diabetes. Puede manifestarse mientras est embarazada. La diabetes desaparece despus de dar a luz. Si no recibe tratamiento, esta afeccin puede causarles problemas a usted y a su beb. Cules son las causas? La causa de esta afeccin son cambios que ocurren en su cuerpo cuando est embarazada. Cuando esos cambios suceden:  Un rgano del cuerpo llamado pncreas no produce suficiente insulina.  El cuerpo no puede utilizar la insulina de la manera correcta. El azcar no puede entrar en las clulas del cuerpo. El azcar permanece en la sangre. Esto provoca un nivel alto de azcar en la sangre.   Qu incrementa el riesgo?  Ser mayor de 25 aos durante el embarazo.  Tener a alguien con diabetes en la familia.  Exceso de peso corporal.  Haber tenido esta afeccin antes.  Sndrome del ovario poliqustico.  Estar embarazada de ms de un beb. Cules son los signos o sntomas?  Tener sed con frecuencia.  Tener hambre con frecuencia.  Tener necesidad de orinar con mayor frecuencia. Cmo se trata?  Siga una dieta saludable.  Haga ms ejercicio.  Controle su nivel de azcar en la sangre con frecuencia.  Aplquese insulina o tome otros medicamentos, si es necesario.  Trabaje con un experto en esta afeccin, si se lo recomiendan. Siga estas instrucciones en su casa: Infrmese sobre la diabetes Pregntele al mdico lo siguiente:  Con qu frecuencia debo controlarme el azcar en la sangre? Dnde obtengo el equipo?  Qu medicamentos necesito? Cundo debo tomarlos?  Es necesario que me rena con un instructor?  A quin puedo llamar si tengo preguntas?  Dnde puedo encontrar un grupo de apoyo? Indicaciones generales  Use los medicamentos solamente como se lo haya indicado el mdico.  Mantenga un peso saludable.  Beba suficiente  lquido como para mantener la orina de color amarillo plido.  Use una brazalete de alerta o lleve una tarjeta que muestre que tiene esta afeccin.  Cumpla con todas las visitas de seguimiento. Dnde buscar ms informacin  American Diabetes Association (ADA) (Asociacin Estadounidense de la Diabetes): diabetes.org  Association of Diabetes Care & Education Specialists (ADCES) (Asociacin de Especialistas en Atencin y Educacin sobre la Diabetes): diabeteseducator.org  Centers for Disease Control and Prevention (Centros para el Control y la Prevencin de Enfermedades, CDC): cdc.gov  American Pregnancy Association (Asociacin Americana del Embarazo): americanpregnancy.org  U.S. Department of Agriculture MyPlate (MyPlate del Departamento de Agricultura de los EE.UU.): myplate.gov Comunquese con un mdico si:  Su nivel de azcar en la sangre es igual o mayor que 240mg/dl (13.3mmol/dl).  Su nivel de azcar en la sangre es igual o mayor que 200mg/dl (11.1mmol/l), y tiene cetonas en la orina.  Tiene fiebre.  Ha estado enferma durante 2 o ms das y no mejora.  Tiene cualquiera de estos problemas durante ms de 6horas: ? Vomita cada vez que come o bebe. ? Presenta heces lquidas (diarrea). Solicite ayuda de inmediato si:  No puede pensar con claridad.  No respira bien.  Tiene mucha cantidad de cetonas en la orina.  El beb parece moverse menos de lo normal.  Le comienza a salir lquido o sangre anmalos de la vagina.  Comienza a tener contracciones antes de la fecha de parto. Siente que el vientre se endurece.  Tiene un dolor de cabeza muy intenso. Estos sntomas pueden indicar una emergencia. Solicite ayuda de inmediato. Comunquese con el servicio de emergencias de su localidad (911 en   los [place]).  No espere a ver si los sntomas desaparecen.  No conduzca por sus propios medios hasta el hospital. Resumen  La diabetes gestacional es una forma de diabetes.  Puede manifestarse mientras est embarazada.  Esta afeccin ocurre cuando el cuerpo no puede producir insulina o no puede utilizarla de la manera correcta.  Siga una dieta saludable, haga actividad fsica y use los medicamentos o la insulina como se lo haya indicado el mdico.  Informe a su mdico si su nivel de azcar en la sangre es alto, tiene fiebre o vomita cada vez que come o bebe.  Obtenga ayuda de inmediato si no puede pensar con claridad, no respira bien o su beb parece moverse menos de lo normal. Esta informacin no tiene como fin reemplazar el consejo del mdico. Asegrese de hacerle al mdico cualquier pregunta que tenga. Document Revised: 09/06/2019 Document Reviewed: 09/06/2019 Elsevier Patient Education  2021 Elsevier Inc.  

## 2020-06-13 NOTE — Progress Notes (Signed)
Subjective:  Joanne [name] is a 40 y.o. G4P3003 at 6w2dbeing seen today for ongoing prenatal care.  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  Patient reports no complaints.  Contractions: Not present. Vag. Bleeding: None.  Movement: Present. Denies leaking of fluid.   The following portions of the patient's history were reviewed and updated as appropriate: allergies, current medications, past family history, past medical history, past social history, past surgical history and problem list. Problem list updated.  Objective:   Vitals:   06/13/20 1121  BP: 101/61  Pulse: 81  Weight: 155 lb (70.3 kg)    Fetal Status: Fetal Heart Rate (bpm): 151   Movement: Present     General:  Alert, oriented and cooperative. Patient is in no acute distress.  Skin: Skin is warm and dry. No rash noted.   Cardiovascular: Normal heart rate noted  Respiratory: Normal respiratory effort, no problems with respiration noted  Abdomen: Soft, gravid, appropriate for gestational age. Pain/Pressure: Absent     Pelvic:  Cervical exam deferred        Extremities: Normal range of motion.  Edema: None  Mental Status: Normal mood and affect. Normal behavior. Normal judgment and thought content.   Urinalysis:      Assessment and Plan:  Pregnancy: G4P3003 at 251w2d1. Supervision of other normal pregnancy, antepartum Stable  2. H/O cesarean section Desires repeat with BTL  3. Hypothyroidism affecting pregnancy in second trimester Continue with Synthyroid - TSH  4. Diet controlled gestational diabetes mellitus (GDM) in second trimester GDM and pregnancy reviewed with  pt Maternal and fetal risks discussed CBG goals reviewed Importance of following diet and recorded CBG's discussed Instructed to bring long to each visit Reduction of risks with glycemic control, reviewed Growth scans and possible antenatal testing reviewed - Referral to Nutrition and Diabetes Services - glucose blood (ACCU-CHEK GUIDE) test strip; Use to check blood sugars four times a day was instructed  Dispense: 50 each; Refill: 12 - Blood Glucose Monitoring Suppl (ACCU-CHEK GUIDE) w/Device KIT; 1 Device by Does not apply route 4 (four) times daily.  Dispense: 1 kit; Refill: 0 - Accu-Chek Softclix Lancets lancets; 1 each by Other route 4 (four) times daily.  Dispense: 100 each; Refill: 12  5. Placenta previa antepartum in second trimester U/S 06/26/20  AMA Stable  Preterm labor symptoms and general obstetric precautions including but not limited to vaginal bleeding, contractions, leaking of fluid and fetal movement were reviewed in detail with the patient. Please refer to After Visit Summary for other counseling recommendations.  Return in about 2 weeks (around 06/27/2020) for OB visit, face to face, MD only.   ErChancy MilroyMD

## 2020-06-13 NOTE — Progress Notes (Signed)
+   Fetal movement. No complaints.  

## 2020-06-14 LAB — TSH: TSH: 2.7 u[IU]/mL (ref 0.450–4.500)

## 2020-06-21 ENCOUNTER — Ambulatory Visit: Payer: Self-pay | Admitting: Registered"

## 2020-06-21 ENCOUNTER — Encounter: Payer: Self-pay | Attending: Obstetrics and Gynecology | Admitting: Registered"

## 2020-06-21 ENCOUNTER — Other Ambulatory Visit: Payer: Self-pay

## 2020-06-21 ENCOUNTER — Encounter: Payer: Self-pay | Admitting: Registered"

## 2020-06-21 DIAGNOSIS — O24410 Gestational diabetes mellitus in pregnancy, diet controlled: Secondary | ICD-10-CM | POA: Insufficient documentation

## 2020-06-21 DIAGNOSIS — O24419 Gestational diabetes mellitus in pregnancy, unspecified control: Secondary | ICD-10-CM

## 2020-06-21 DIAGNOSIS — Z3A00 Weeks of gestation of pregnancy not specified: Secondary | ICD-10-CM | POA: Insufficient documentation

## 2020-06-21 NOTE — Progress Notes (Signed)
Interpreter services provided by Darrick Meigs [phone] from [place] video   Patient was seen on 06/21/2020 for Gestational Diabetes self-management. EDD 08/27/20; [redacted]w[redacted]d . Patient states prior history of GDM 2 yrs ago. Diet history obtained. Patient reported dietary recall sounds like she is not eating enough. Pt states she has reduced appetite and does not feel hungry. Per vitals flow sheet it appears she is gaining appropriate weight. Beverages include water, diluted juice, coffee con leche.    The following learning objectives were met by the patient :   States why dietary management is important in controlling blood glucose  Describes the effects of carbohydrates on blood glucose levels  Demonstrates ability to create a balanced meal plan  Demonstrates carbohydrate counting   States when to check blood glucose levels  Demonstrates proper blood glucose monitoring techniques  States the effect of stress and exercise on blood glucose levels  States the importance of limiting caffeine and abstaining from alcohol and smoking  Plan:  Aim for 3 Carbohydrate Choices per meal (45 grams) +/- 1 either way  Aim for 1-2 Carbohydrate Choices per snack Begin reading food labels for Total Carbohydrate of foods If OK with your MD, consider  increasing your activity level by walking, Arm Chair Exercises or other activity daily as tolerated Begin checking Blood Glucose before breakfast and 2 hours after first bite of breakfast, lunch and dinner as directed by MD  Bring Log Book/Sheet and meter to every medical appointment  Take medication if directed by MD  Blood glucose monitor given: Patient had already picked up meter from pharmacy $140. Pt was informed that because she doesn't have insurance she qualifies for free meter program. Pt states she plans to just use the Accu-chek she has. Patient is comfortable using meter from previous GDM experience.  Patient instructed to monitor glucose levels: FBS: 60 -  95 mg/dl 2 hour: <120 mg/dl  Patient received the following handouts:  Nutrition Diabetes and Pregnancy  Carbohydrate Counting List  Blood glucose Log Sheet  Patient will be seen for follow-up in 2 weeks or as needed.

## 2020-06-26 ENCOUNTER — Other Ambulatory Visit: Payer: Self-pay

## 2020-06-26 ENCOUNTER — Ambulatory Visit: Payer: Self-pay | Attending: Obstetrics and Gynecology

## 2020-06-26 ENCOUNTER — Ambulatory Visit: Payer: Self-pay | Admitting: *Deleted

## 2020-06-26 ENCOUNTER — Other Ambulatory Visit: Payer: Self-pay | Admitting: *Deleted

## 2020-06-26 ENCOUNTER — Encounter: Payer: Self-pay | Admitting: *Deleted

## 2020-06-26 VITALS — BP 105/50 | HR 75

## 2020-06-26 DIAGNOSIS — E039 Hypothyroidism, unspecified: Secondary | ICD-10-CM

## 2020-06-26 DIAGNOSIS — O34219 Maternal care for unspecified type scar from previous cesarean delivery: Secondary | ICD-10-CM

## 2020-06-26 DIAGNOSIS — O322XX0 Maternal care for transverse and oblique lie, not applicable or unspecified: Secondary | ICD-10-CM

## 2020-06-26 DIAGNOSIS — O09523 Supervision of elderly multigravida, third trimester: Secondary | ICD-10-CM

## 2020-06-26 DIAGNOSIS — O4403 Placenta previa specified as without hemorrhage, third trimester: Secondary | ICD-10-CM

## 2020-06-26 DIAGNOSIS — O99283 Endocrine, nutritional and metabolic diseases complicating pregnancy, third trimester: Secondary | ICD-10-CM

## 2020-06-26 DIAGNOSIS — O4402 Placenta previa specified as without hemorrhage, second trimester: Secondary | ICD-10-CM | POA: Insufficient documentation

## 2020-06-26 DIAGNOSIS — Z3A31 31 weeks gestation of pregnancy: Secondary | ICD-10-CM

## 2020-06-26 DIAGNOSIS — Z363 Encounter for antenatal screening for malformations: Secondary | ICD-10-CM

## 2020-06-27 ENCOUNTER — Encounter: Payer: Self-pay | Admitting: Women's Health

## 2020-06-27 ENCOUNTER — Ambulatory Visit (INDEPENDENT_AMBULATORY_CARE_PROVIDER_SITE_OTHER): Payer: Self-pay | Admitting: Obstetrics and Gynecology

## 2020-06-27 ENCOUNTER — Encounter: Payer: Self-pay | Admitting: Obstetrics and Gynecology

## 2020-06-27 ENCOUNTER — Other Ambulatory Visit: Payer: Self-pay

## 2020-06-27 VITALS — BP 95/59 | HR 71 | Wt 155.0 lb

## 2020-06-27 DIAGNOSIS — O24410 Gestational diabetes mellitus in pregnancy, diet controlled: Secondary | ICD-10-CM

## 2020-06-27 DIAGNOSIS — O99283 Endocrine, nutritional and metabolic diseases complicating pregnancy, third trimester: Secondary | ICD-10-CM

## 2020-06-27 DIAGNOSIS — O4402 Placenta previa specified as without hemorrhage, second trimester: Secondary | ICD-10-CM

## 2020-06-27 DIAGNOSIS — Z98891 History of uterine scar from previous surgery: Secondary | ICD-10-CM

## 2020-06-27 DIAGNOSIS — Z3480 Encounter for supervision of other normal pregnancy, unspecified trimester: Secondary | ICD-10-CM

## 2020-06-27 DIAGNOSIS — E039 Hypothyroidism, unspecified: Secondary | ICD-10-CM

## 2020-06-27 NOTE — Progress Notes (Signed)
ROB [redacted]w[redacted]d  CC: None      

## 2020-06-27 NOTE — Progress Notes (Signed)
   PRENATAL VISIT NOTE  Subjective:  Joanne [name] is a 40 y.o. G4P3003 at [redacted]w[redacted]d being seen today for ongoing prenatal care.  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  Patient reports no complaints.  Contractions: Not present. Vag. Bleeding: None.  Movement: Present. Denies leaking of fluid.   The following portions of the patient's history were reviewed and updated as appropriate: allergies, current medications, past family history, past medical history, past social history, past surgical history and problem list.   Objective:   Vitals:   06/27/20 1048  BP: (!) 95/59  Pulse: 71  Weight: 155 lb (70.3 kg)    Fetal Status: Fetal Heart Rate (bpm): 146 Fundal Height: 32 cm Movement: Present     General:  Alert, oriented and cooperative. Patient is in no acute distress.  Skin: Skin is warm and dry. No rash noted.   Cardiovascular: Normal heart rate noted  Respiratory: Normal respiratory effort, no problems with respiration noted  Abdomen: Soft, gravid, appropriate for gestational age.  Pain/Pressure: Present     Pelvic: Cervical exam deferred        Extremities: Normal range of motion.  Edema: Trace  Mental Status: Normal mood and affect. Normal behavior. Normal judgment and thought content.   Assessment and Plan:  Pregnancy: G4P3003 at [redacted]w[redacted]d 1. Supervision of other normal pregnancy, antepartum Patient is doing well  2. Diet controlled gestational diabetes mellitus (GDM) in third trimester CBGs reviewed and fasting within range. 50% of pp out of range mainly after lunch and dinner. Patient admits to not following the diabetic diet and plans to  make corrections Discussed starting metformin if no improvement in CBGs at next visit Discussed benefits of exercise to improve CBGs as well Follow up growth ultrasound scheduled  3. Hypothyroidism affecting pregnancy in third trimester Continue synthroid TSH stable last visit  4. H/O cesarean section Patient will be scheduled for repeat c-section with BTL at 39 weeks  5. Placenta previa antepartum in second trimester Resolved on 5/10 ultrasound  Preterm labor symptoms and general obstetric precautions including but not limited to vaginal bleeding, contractions, leaking of fluid and fetal movement were reviewed in detail with the patient. Please refer to After Visit Summary for other counseling recommendations.   Return in about 1 week (around 07/04/2020) for in person, ROB, High risk.  Future Appointments  Date Time Provider Department Center  07/24/2020  8:00 AM [facility] NURSE [facility]  07/24/2020  8:15 AM [facility] US2 [facility]    Catalina Antigua, MD

## 2020-07-02 ENCOUNTER — Encounter: Payer: Self-pay | Admitting: *Deleted

## 2020-07-02 ENCOUNTER — Telehealth: Payer: Self-pay | Admitting: *Deleted

## 2020-07-02 NOTE — Telephone Encounter (Signed)
Call to patient with Spanish Clarisse Gouge ID [id number] from [facility].  Advised c-sec scheduled for 08-21-20 at 0930 and arrive 0730. Letter to be via Mail and copy viewable in My Chart.  Denies questions. Encounter closed.

## 2020-07-05 ENCOUNTER — Other Ambulatory Visit: Payer: Self-pay

## 2020-07-05 ENCOUNTER — Encounter: Payer: Self-pay | Admitting: Obstetrics and Gynecology

## 2020-07-05 ENCOUNTER — Ambulatory Visit (INDEPENDENT_AMBULATORY_CARE_PROVIDER_SITE_OTHER): Payer: Self-pay | Admitting: Obstetrics and Gynecology

## 2020-07-05 VITALS — BP 96/58 | HR 74 | Wt 157.0 lb

## 2020-07-05 DIAGNOSIS — E039 Hypothyroidism, unspecified: Secondary | ICD-10-CM

## 2020-07-05 DIAGNOSIS — Z98891 History of uterine scar from previous surgery: Secondary | ICD-10-CM

## 2020-07-05 DIAGNOSIS — O24410 Gestational diabetes mellitus in pregnancy, diet controlled: Secondary | ICD-10-CM

## 2020-07-05 DIAGNOSIS — Z3480 Encounter for supervision of other normal pregnancy, unspecified trimester: Secondary | ICD-10-CM

## 2020-07-05 DIAGNOSIS — O4402 Placenta previa specified as without hemorrhage, second trimester: Secondary | ICD-10-CM

## 2020-07-05 DIAGNOSIS — O99283 Endocrine, nutritional and metabolic diseases complicating pregnancy, third trimester: Secondary | ICD-10-CM

## 2020-07-05 NOTE — Patient Instructions (Signed)
Tercer trimestre de embarazo Third Trimester of Pregnancy  El tercer trimestre de embarazo va desde la semana 28 hasta la semana 40. Tambin se dice que va desde el mes 7 hasta el mes 9. En este trimestre, el beb en gestacin (feto) crece muy rpidamente. Hacia el final del noveno mes, el beb en gestacin mide alrededor de 20pulgadas (45cm) de largo. Pesa entre 6y 10libras (2,70y 4,50kg). Cambios en el cuerpo durante el tercer trimestre Su organismo contina atravesando por muchos cambios durante este perodo. Los cambios varan y generalmente vuelven a la normalidad despus del nacimiento del beb. Cambios fsicos  Seguir aumentando de peso. Puede ser que aumente entre 25 y 35libras (11 y 16kg) hacia el final del embarazo. Si tiene bajo peso, puede aumentar entre 28 y 40lb (unos 13 a 18kg). Si tiene sobrepeso, puede aumentar entre 15 y 25 libras (unos 7 a 11kg).  Podrn aparecer las primeras estras en las caderas, el vientre (abdomen) y las mamas.  Las mamas seguirn creciendo y pueden doler. Un lquido amarillo (calostro) puede salir de sus pechos. Esta es la primera leche que usted produce para el beb.  Tal vez haya cambios en el cabello.  El ombligo puede salir hacia afuera.  Puede observar que se le hinchan ms las manos, la cara o los tobillos. Cambios en la salud  Es posible que tenga acidez estomacal.  Es posible que tenga dificultades para defecar (estreimiento).  Pueden aparecerle hemorroides. Estas son venas hinchadas en el ano que pueden picar o doler.  Puede comenzar a tener venas hinchadas (vrices) en las piernas.  Puede presentar ms dolor en la pelvis, la espalda o los muslos.  Puede presentar ms hormigueo o entumecimiento en las manos, los brazos y las piernas. La piel de su vientre tambin puede sentirse entumecida.  Es posible que sienta falta de aire a medida que el tero se agranda. Otros cambios  Es posible que haga pis (orine) con mayor  frecuencia.  Puede tener ms problemas para dormir.  Puede notar que el beb en gestacin "baja" o se mueve ms hacia bajo, en el vientre.  Puede notar ms secrecin proveniente de la vagina.  Puede sentir las articulaciones flojas y puede sentir dolor alrededor del hueso plvico. Siga estas instrucciones en su casa: Medicamentos  Use los medicamentos de venta libre y los recetados solamente como se lo haya indicado el mdico. Algunos medicamentos no son seguros durante el embarazo.  Tome vitaminas prenatales que contengan por lo menos 600microgramos (mcg) de cido flico. Comida y bebida  Consuma comidas saludables que incluyan lo siguiente: ? Frutas y verduras frescas. ? Cereales integrales. ? Buenas fuentes de protenas, como carne, huevos y tofu. ? Productos lcteos con bajo contenido de grasa.  Evite la carne cruda y el jugo, la leche y el queso sin pasteurizar. Estos portan grmenes que pueden provocar dao tanto a usted como al beb.  Tome 4 o 5 comidas pequeas en lugar de 3 comidas abundantes al da.  Es posible que deba tomar medidas para prevenir o tratar los problemas para defecar: ? Beber suficiente lquido para mantener el pis (orina) de color amarillo plido. ? Come alimentos ricos en fibra. Entre ellos, frijoles, cereales integrales y frutas y verduras frescas. ? Limitar los alimentos con alto contenido de grasa y azcar. Estos incluyen alimentos fritos o dulces. Actividad  Haga ejercicios solamente como se lo haya indicado el mdico. Interrumpa la actividad fsica si comienza a tener clicos en el tero.  Evite   levantar pesos [employer].  No haga ejercicio si hace demasiado calor, hay demasiada humedad o se encuentra en un lugar de mucha altura (altitud elevada).  Si lo desea, puede continuar teniendo [employer], a menos que el mdico le indique lo contrario. Alivio del dolor y del malestar  Haga pausas con frecuencia y descanse con las piernas  levantadas (elevadas) si tiene calambres en las piernas o dolor en la parte baja de la espalda.  Dese baos de asiento con agua tibia para Best boy o las molestias causadas por las hemorroides. Use una crema para las hemorroides si el mdico la autoriza.  Use un sostn que le brinde buen soporte si sus mamas estn sensibles.  Si desarrolla venas hinchadas y abultadas en las piernas: ? Use medias de compresin segn las indicaciones de su mdico. ? Levante los pies durante 24minutos, 3 o 4veces por Training and development officer. ? Limite la sal en sus alimentos. Seguridad  Hable con el mdico antes de Control and instrumentation engineer.  No se d baos de inmersin en agua caliente, baos turcos ni saunas.  Use el cinturn de seguridad en todo momento mientras vaya en auto.  Hable con el mdico si alguien le est haciendo dao o gritando [place]. Preparacin para la llegada del beb Para prepararse para la llegada de su beb:  Tome clases prenatales.  Visite el hospital y recorra el rea de maternidad.  Compre un asiento de seguridad [employer] atrs para llevar al beb en el automvil. Aprenda cmo instalarlo en el auto.  Prepare la habitacin del beb. Saque todas las almohadas y los animales de peluche de la cuna del beb. Instrucciones generales  Evite el contacto con las bandejas sanitarias de los gatos y la tierra que estos animales usan. Estos contienen grmenes que pueden daar al beb y causar la prdida del beb ya sea aborto espontneo o muerte fetal.  No se haga duchas vaginales ni use tampones. No use tampones ni toallas higinicas perfumadas.  No fume ni consuma ningn producto que contenga nicotina o tabaco. Si necesita ayuda para dejar de fumar, consulte al mdico.  No beba alcohol.  No use medicamentos a base de hierbas, drogas ilegales, ni medicamentos que el mdico no haya autorizado. Las sustancias qumicas de estos productos pueden afectar al beb.  Cumpla con todas las  visitas de seguimiento. Esto es importante. Dnde buscar ms informacin  American Pregnancy Association (Asociacin Americana del Embarazo): americanpregnancy.org  [employer] of Obstetricians and Gynecologists (Colegio Estadounidense de Obstetras y Gineclogos): www.acog.org  Office on [employer] ([place]): KeywordPortfolios.com.br Comunquese con un mdico si:  Tiene fiebre.  Tiene clicos leves o siente presin en la parte baja del vientre.  Sufre un dolor persistente en el abdomen.  Vomita o hace deposiciones acuosas (diarrea).  Advierte lquido con mal olor que proviene de la vagina.  Siente dolor al orinar o hace orina con mal olor.  Tiene un dolor de cabeza que no desaparece despus de Teacher, adult education.  Nota cambios en la visin o ve manchas delante de los ojos. Solicite ayuda de inmediato si:  Rompe la bolsa.  Tiene contracciones regulares separadas por menos de 54minutos.  Tiene sangrado o pequeas prdidas vaginales.  Tiene clicos o dolor muy intensos en el vientre.  Tiene dificultad para respirar.  Sientes dolor en el pecho.  Se desmaya.  No ha sentido al beb moverse durante el tiempo que le indic el mdico.  Tiene dolor, hinchazn o enrojecimiento nuevos  en un brazo o una pierna o se produce un aumento de alguno de estos sntomas. Resumen  El tercer trimestre comprende desde la [employer] la [id number] (desde el mes7 hasta el mes9). Esta es la poca en que el beb en gestacin crece muy rpidamente.  Durante este perodo, las molestias pueden aumentar a medida que usted sube de peso y el beb crece.  Preprese para la llegada del beb: asista a las clases prenatales, compre un asiento de seguridad orientado hacia atrs para llevar al beb en auto y prepare la habitacin del beb.  Solicite ayuda de inmediato si tiene sangrado por la vagina, siente dolor en el pecho y tiene dificultad para respirar, o si  no ha sentido al beb moverse durante el tiempo que le indic el mdico. Esta informacin no tiene Marine scientist el consejo del mdico. Asegrese de hacerle al mdico cualquier pregunta que tenga. Document Revised: 08/17/2019 Document Reviewed: 08/17/2019 Elsevier Patient Education  [place].

## 2020-07-05 NOTE — Progress Notes (Signed)
Subjective:  Joanne [name] is a 40 y.o. G4P3003 at [redacted]w[redacted]d being seen today for ongoing prenatal care.  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  Patient reports no complaints.  Contractions: Not present. Vag. Bleeding: None.  Movement: Present. Denies leaking of fluid.   The following portions of the patient's history were reviewed and updated as appropriate: allergies, current medications, past family history, past medical history, past social history, past surgical history and problem list. Problem list updated.  Objective:   Vitals:   07/05/20 1014  BP: (!) 96/58  Pulse: 74  Weight: 157 lb (71.2 kg)    Fetal Status: Fetal Heart Rate (bpm): 145   Movement: Present     General:  Alert, oriented and cooperative. Patient is in no acute distress.  Skin: Skin is warm and dry. No rash noted.   Cardiovascular: Normal heart rate noted  Respiratory: Normal respiratory effort, no problems with respiration noted  Abdomen: Soft, gravid, appropriate for gestational age. Pain/Pressure: Absent     Pelvic:  Cervical exam deferred        Extremities: Normal range of motion.  Edema: None  Mental Status: Normal mood and affect. Normal behavior. Normal judgment and thought content.   Urinalysis:      Assessment and Plan:  Pregnancy: G4P3003 at [redacted]w[redacted]d  1. Supervision of other normal pregnancy, antepartum Stable  2. Diet controlled gestational diabetes mellitus (GDM) in third trimester CBG's in goal range for the most part. A few outliers related to dietary choices Growth 36 % 06/26/20. F/U scheduled Continue with diet  3. Hypothyroidism affecting pregnancy  in third trimester Stable  4. H/O cesarean section For repeat with BTL at 39 weeks  5. Placenta previa antepartum in second trimester Resolved  Preterm labor symptoms and general obstetric precautions including but not limited to vaginal bleeding, contractions, leaking of fluid and fetal movement were reviewed in detail with the patient. Please refer to After Visit Summary for other counseling recommendations.  Return in about 2 weeks (around 07/19/2020) for OB visit, face to face, MD only.   Hermina Staggers, MD

## 2020-07-05 NOTE — Progress Notes (Signed)
Pt reports fetal movement, denies pain.  

## 2020-07-17 ENCOUNTER — Other Ambulatory Visit: Payer: Self-pay | Admitting: Family Medicine

## 2020-07-19 ENCOUNTER — Encounter: Payer: Self-pay | Admitting: Obstetrics & Gynecology

## 2020-07-19 ENCOUNTER — Other Ambulatory Visit: Payer: Self-pay

## 2020-07-19 ENCOUNTER — Ambulatory Visit (INDEPENDENT_AMBULATORY_CARE_PROVIDER_SITE_OTHER): Payer: Self-pay | Admitting: Obstetrics & Gynecology

## 2020-07-19 VITALS — BP 103/65 | HR 73 | Wt 157.0 lb

## 2020-07-19 DIAGNOSIS — O99283 Endocrine, nutritional and metabolic diseases complicating pregnancy, third trimester: Secondary | ICD-10-CM

## 2020-07-19 DIAGNOSIS — O24410 Gestational diabetes mellitus in pregnancy, diet controlled: Secondary | ICD-10-CM

## 2020-07-19 DIAGNOSIS — E039 Hypothyroidism, unspecified: Secondary | ICD-10-CM

## 2020-07-19 DIAGNOSIS — Z3480 Encounter for supervision of other normal pregnancy, unspecified trimester: Secondary | ICD-10-CM

## 2020-07-19 DIAGNOSIS — Z98891 History of uterine scar from previous surgery: Secondary | ICD-10-CM

## 2020-07-19 NOTE — Progress Notes (Signed)
ROB [redacted]w[redacted]d Pt has blood sugar log.  CC: None

## 2020-07-19 NOTE — Patient Instructions (Signed)
Parto por cesrea Cesarean Delivery El nacimiento o parto por cesrea es el parto quirrgico de un beb a travs de una incisin en el abdomen y Careers information officer. Se lo conoce como cesrea. Este procedimiento se puede programar con anticipacin o se puede Publishing copy una situacin de Associate Professor. Informe al mdico acerca de lo siguiente:  Cualquier alergia que tenga.  Todos los [employer], incluidos vitaminas, hierbas, gotas oftlmicas, cremas y [address] de [address].  Cualquier problema previo que usted o algn miembro de su familia haya tenido con los anestsicos.  Cualquier trastorno de la sangre que tenga.  Cirugas a las que se haya sometido.  Cualquier afeccin mdica que tenga.  Si usted o Research scientist (physical sciences) tiene antecedentes de trombosis venosa profunda (TVP) o embolia pulmonar (EP). Cules son los riesgos? En general, se trata de un procedimiento seguro. Sin embargo, pueden ocurrir complicaciones, por ejemplo:  Infeccin.  Sangrado.  Reacciones alrgicas a los medicamentos.  Daos a otras estructuras u rganos.  Cogulos de [place].  Lesiones al beb. Qu ocurre antes del procedimiento? Indicaciones generales  Siga las indicaciones del mdico respecto de las restricciones en las comidas o las bebidas.  Si sabe que va a tener un parto por cesrea, no se rasure la zona pbica. Rasurarse antes del procedimiento puede aumentar el riesgo de infeccin.  Pdale a alguien que la lleve a su casa desde el hospital.  Pregntele al mdico qu medidas se tomarn para prevenir una infeccin. Estas pueden incluir: ? Rasurar el vello del lugar de la ciruga. ? Lavar la piel con un jabn antisptico. ? Suministrar antibiticos.  Segn el motivo del parto por cesrea, es posible que se le realice un examen fsico o una prueba adicional, como una ecografa.  Posiblemente le realicen un anlisis de [place]. Preguntas para hacerle al mdico  Consulte al mdico  sobre: ? Multimedia programmer o suspender los medicamentos que toma habitualmente. Esto es muy importante si toma medicamentos para la diabetes o anticoagulantes. ? El plan para Human resources officer. Esto es muy importante si piensa amamantar a su beb. ? Designer, industrial/product hospital despus del procedimiento. ? Cualquier inquietud que pueda tener sobre recibir hemoderivados, si los necesita durante el procedimiento. ? Almacenamiento de sangre del cordn, si planea guardar la sangre del cordn umbilical del beb.  Quiz tambin desee preguntarle al mdico lo siguiente: ? Si va a poder Publishing rights manager a su beb mientras an se encuentre en el quirfano. ? Si su beb puede quedarse con usted inmediatamente despus del procedimiento y durante su recuperacin. ? Si un familiar o la persona que usted elija puede ingresar al quirfano y Personal assistant con usted durante el procedimiento, inmediatamente despus de este y durante su recuperacin. Qu ocurre durante el procedimiento?  Le colocarn una va intravenosa en una vena.  Le administrarn lquido y [address], tales como antibiticos, antes de la [place].  Le colocarn monitores fetales en el abdomen para controlar la frecuencia cardaca de su beb.  Se le puede entregar una bata especial de calentamiento para mantener estable la temperatura corporal.  Se le puede insertar un catter en la vejiga a travs de la uretra. Esto se hace para drenar la orina durante el procedimiento.  Pueden administrarle uno o ms de los siguientes medicamentos: ? Un medicamento para adormecer la zona (anestesia local). ? Un medicamento que la har dormir (anestesia general). ? Un medicamento (anestesia regional) que se le inyecta en la espalda o a travs  de un tubo pequeo y delgado que se le coloca en la espalda (anestesia raqudea o anestesia epidural). Esto adormece la parte del cuerpo que est por debajo del sitio de la inyeccin y Environmental consultant  despierta durante el procedimiento. Si llega a sentir nuseas, dgaselo al mdico. Tendr medicamentos disponibles para ayudarla a reducir cualquier nusea que pueda sentir.  Le harn una incisin en el abdomen y [employer].  Si est despierta durante el procedimiento, puede sentir tirones en el abdomen, pero no debera Financial risk analyst. Si siente dolor, dgaselo al mdico inmediatamente.  Se sacar al beb del tero. Es posible que sienta ms presin o un tirn, mientras esto sucede.  Inmediatamente despus del nacimiento, se secar al beb y se lo mantendr caliente. Podr sostener y Museum/gallery exhibitions officer a su beb.  Durante este momento, es posible que se pince y corte el cordn umbilical. Esto ocurre por lo general despus de un perodo de 1 a 2 minutos despus del parto.  Se le extraer la placenta del tero.  Las incisiones se cerrarn con puntos (suturas). Es posible que se apliquen grapas, goma para cerrar la piel o tiras [phone] en la incisin del abdomen.  Pueden colocarle vendas (vendajes) sobre la incisin del abdomen. El procedimiento puede variar segn el mdico y el hospital.   Ladell Heads ocurre despus del procedimiento?  Le controlarn la presin arterial, la frecuencia cardaca, la frecuencia respiratoria y Air cabin crew de oxgeno en la sangre hasta que le den el alta del hospital.  Puede seguir recibiendo lquidos o medicamentos por va intravenosa.  Sentir un poco de Engineer, mining. Tendr analgsicos disponibles para ayudarla a Human resources officer.  Para evitar la formacin de cogulos de sangre: ? Se le pueden administrar medicamentos. ? [employer] o dispositivos de compresin. ? Se le indicar que camine cuando pueda hacerlo.  El personal del hospital alentar y apoyar que cree lazos con su beb. En el hospital, es posible que le permitan que el beb permanezca en la misma habitacin que usted (internacin conjunta) durante la hospitalizacin para fomentar la creacin del  vnculo y un amamantamiento exitoso.  Se le puede sugerir que tosa y respire profundamente con frecuencia. Esto ayuda a evitar problemas pulmonares.  Si tiene un catter [employer] drena la orina, se le quitar lo antes posible despus del procedimiento. Resumen  El nacimiento o parto por cesrea es el parto quirrgico de un beb a travs de una incisin en el abdomen y Careers information officer.  Siga las indicaciones del mdico con respecto a las restricciones de comidas o bebidas antes del procedimiento.  Sentir algo de dolor despus del procedimiento. Tendr analgsicos disponibles para ayudarla a Human resources officer.  El personal del hospital alentar y apoyar que cree lazos con su beb despus del procedimiento. En el hospital, es posible que le permitan que el beb permanezca en la misma habitacin que usted (internacin conjunta) durante la hospitalizacin para fomentar la creacin del vnculo y un amamantamiento exitoso. Esta informacin no tiene Theme park manager el consejo del mdico. Asegrese de hacerle al mdico cualquier pregunta que tenga. Document Revised: 11/30/2019 Document Reviewed: 09/17/2017 Elsevier Patient Education  2021 [employer].

## 2020-07-19 NOTE — Progress Notes (Signed)
   PRENATAL VISIT NOTE  Subjective:  Joanne [name] is a 40 y.o. G4P3003 at [redacted]w[redacted]d being seen today for ongoing prenatal care.  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  Patient reports nausea.  Contractions: Irritability. Vag. Bleeding: None.  Movement: Present. Denies leaking of fluid.   The following portions of the patient's history were reviewed and updated as appropriate: allergies, current medications, past family history, past medical history, past social history, past surgical history and problem list.   Objective:   Vitals:   07/19/20 0843  BP: 103/65  Pulse: 73  Weight: 157 lb (71.2 kg)    Fetal Status: Fetal Heart Rate (bpm): 148   Movement: Present     General:  Alert, oriented and cooperative. Patient is in no acute distress.  Skin: Skin is warm and dry. No rash noted.   Cardiovascular: Normal heart rate noted  Respiratory: Normal respiratory effort, no problems with respiration noted  Abdomen: Soft, gravid, appropriate for gestational age.  Pain/Pressure: Present     Pelvic: Cervical exam deferred        Extremities: Normal range of motion.  Edema: None  Mental Status: Normal mood and affect. Normal behavior. Normal judgment and thought content.   Assessment and Plan:  Pregnancy: G4P3003 at [redacted]w[redacted]d 1. Supervision of other normal pregnancy, antepartum GDM well controlled on diet 90% in range  Preterm labor symptoms and general obstetric precautions including but not limited to vaginal bleeding, contractions, leaking of fluid and fetal movement were reviewed in detail with the patient. Please refer to After Visit Summary for other  counseling recommendations.   Return in about 2 weeks (around 08/02/2020) for GBS.  Future Appointments  Date Time Provider Department Center  07/24/2020  8:00 AM [facility] NURSE [facility]  07/24/2020  8:15 AM [facility]    Scheryl Darter, MD

## 2020-07-24 ENCOUNTER — Other Ambulatory Visit: Payer: Self-pay | Admitting: *Deleted

## 2020-07-24 ENCOUNTER — Encounter: Payer: Self-pay | Admitting: *Deleted

## 2020-07-24 ENCOUNTER — Ambulatory Visit: Payer: Self-pay | Admitting: *Deleted

## 2020-07-24 ENCOUNTER — Other Ambulatory Visit: Payer: Self-pay

## 2020-07-24 ENCOUNTER — Ambulatory Visit: Payer: Self-pay | Attending: Obstetrics

## 2020-07-24 VITALS — BP 102/59 | HR 70

## 2020-07-24 DIAGNOSIS — Z3A35 35 weeks gestation of pregnancy: Secondary | ICD-10-CM

## 2020-07-24 DIAGNOSIS — O34219 Maternal care for unspecified type scar from previous cesarean delivery: Secondary | ICD-10-CM

## 2020-07-24 DIAGNOSIS — O24410 Gestational diabetes mellitus in pregnancy, diet controlled: Secondary | ICD-10-CM

## 2020-07-24 DIAGNOSIS — O09523 Supervision of elderly multigravida, third trimester: Secondary | ICD-10-CM

## 2020-07-24 DIAGNOSIS — Z362 Encounter for other antenatal screening follow-up: Secondary | ICD-10-CM

## 2020-07-24 DIAGNOSIS — E039 Hypothyroidism, unspecified: Secondary | ICD-10-CM

## 2020-07-24 DIAGNOSIS — O322XX0 Maternal care for transverse and oblique lie, not applicable or unspecified: Secondary | ICD-10-CM

## 2020-07-24 DIAGNOSIS — O99283 Endocrine, nutritional and metabolic diseases complicating pregnancy, third trimester: Secondary | ICD-10-CM

## 2020-08-02 ENCOUNTER — Other Ambulatory Visit (HOSPITAL_COMMUNITY)
Admission: RE | Admit: 2020-08-02 | Discharge: 2020-08-02 | Disposition: A | Discharge status: Home / Self Care | Payer: Self-pay | Source: Ambulatory Visit | Attending: Obstetrics & Gynecology | Admitting: Obstetrics & Gynecology

## 2020-08-02 ENCOUNTER — Ambulatory Visit (INDEPENDENT_AMBULATORY_CARE_PROVIDER_SITE_OTHER): Payer: Self-pay | Admitting: Obstetrics & Gynecology

## 2020-08-02 ENCOUNTER — Other Ambulatory Visit: Payer: Self-pay

## 2020-08-02 VITALS — BP 98/62 | HR 69 | Wt 159.0 lb

## 2020-08-02 DIAGNOSIS — O99283 Endocrine, nutritional and metabolic diseases complicating pregnancy, third trimester: Secondary | ICD-10-CM

## 2020-08-02 DIAGNOSIS — Z3480 Encounter for supervision of other normal pregnancy, unspecified trimester: Secondary | ICD-10-CM | POA: Insufficient documentation

## 2020-08-02 DIAGNOSIS — O24410 Gestational diabetes mellitus in pregnancy, diet controlled: Secondary | ICD-10-CM

## 2020-08-02 DIAGNOSIS — E039 Hypothyroidism, unspecified: Secondary | ICD-10-CM

## 2020-08-02 DIAGNOSIS — Z98891 History of uterine scar from previous surgery: Secondary | ICD-10-CM

## 2020-08-02 NOTE — Progress Notes (Signed)
ADOPT A MOM ROB/GBS. VBAC signed

## 2020-08-02 NOTE — Progress Notes (Signed)
   PRENATAL VISIT NOTE  Subjective:  Joanne [name] is a 40 y.o. G4P3003 at [redacted]w[redacted]d being seen today for ongoing prenatal care.  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  Patient reports no complaints.  Contractions: Irritability. Vag. Bleeding: None.  Movement: Present. Denies leaking of fluid.   The following portions of the patient's history were reviewed and updated as appropriate: allergies, current medications, past family history, past medical history, past social history, past surgical history and problem list.   Objective:   Vitals:   08/02/20 0922  BP: 98/62  Pulse: 69  Weight: 159 lb (72.1 kg)    Fetal Status: Fetal Heart Rate (bpm): 141   Movement: Present     General:  Alert, oriented and cooperative. Patient is in no acute distress.  Skin: Skin is warm and dry. No rash noted.   Cardiovascular: Normal heart rate noted  Respiratory: Normal respiratory effort, no problems with respiration noted  Abdomen: Soft, gravid, appropriate for gestational age.  Pain/Pressure: Present     Pelvic: Cervical exam performed in the presence of a chaperone        Extremities: Normal range of motion.  Edema: Trace  Mental Status: Normal mood and affect. Normal behavior. Normal judgment and thought content.   Assessment and Plan:  Pregnancy: G4P3003 at [redacted]w[redacted]d 1. Supervision of other normal pregnancy, antepartum Routine 36 weeks - Cervicovaginal ancillary only( [place]) - Strep Gp B NAA  2. Hypothyroidism affecting pregnancy in third trimester   3. Diet controlled gestational diabetes mellitus (GDM) in third trimester FBS 75% in range and PP  90% in range, normal growth Needs fetal echo next week to check OFT 4. H/O cesarean section Repeat at 39 weeks  Preterm labor symptoms and general obstetric precautions including but not limited to vaginal bleeding, contractions, leaking of fluid and fetal movement were reviewed in detail with the patient. Please refer to After Visit Summary for other counseling recommendations.   Return in about 1 week (around 08/09/2020).  Future Appointments  Date Time Provider Department Center  08/07/2020 12:30 PM [facility] NURSE [facility]  08/07/2020 12:45 PM [facility]5 [facility]    Scheryl Darter, MD

## 2020-08-03 LAB — CERVICOVAGINAL ANCILLARY ONLY
Chlamydia: NEGATIVE
Comment: NEGATIVE
Comment: NORMAL
Neisseria Gonorrhea: NEGATIVE

## 2020-08-04 LAB — STREP GP B NAA: Strep Gp B NAA: NEGATIVE

## 2020-08-07 ENCOUNTER — Other Ambulatory Visit: Payer: Self-pay | Admitting: *Deleted

## 2020-08-07 ENCOUNTER — Ambulatory Visit: Payer: Self-pay | Admitting: *Deleted

## 2020-08-07 ENCOUNTER — Ambulatory Visit: Payer: Self-pay | Attending: Obstetrics

## 2020-08-07 ENCOUNTER — Ambulatory Visit: Payer: Self-pay | Attending: Maternal & Fetal Medicine

## 2020-08-07 ENCOUNTER — Encounter: Payer: Self-pay | Admitting: *Deleted

## 2020-08-07 ENCOUNTER — Other Ambulatory Visit: Payer: Self-pay

## 2020-08-07 VITALS — BP 102/54 | HR 85

## 2020-08-07 DIAGNOSIS — E039 Hypothyroidism, unspecified: Secondary | ICD-10-CM

## 2020-08-07 DIAGNOSIS — O34219 Maternal care for unspecified type scar from previous cesarean delivery: Secondary | ICD-10-CM

## 2020-08-07 DIAGNOSIS — Z3A37 37 weeks gestation of pregnancy: Secondary | ICD-10-CM | POA: Insufficient documentation

## 2020-08-07 DIAGNOSIS — O24410 Gestational diabetes mellitus in pregnancy, diet controlled: Secondary | ICD-10-CM | POA: Insufficient documentation

## 2020-08-07 DIAGNOSIS — O99283 Endocrine, nutritional and metabolic diseases complicating pregnancy, third trimester: Secondary | ICD-10-CM

## 2020-08-07 DIAGNOSIS — O09523 Supervision of elderly multigravida, third trimester: Secondary | ICD-10-CM

## 2020-08-07 DIAGNOSIS — O368190 Decreased fetal movements, unspecified trimester, not applicable or unspecified: Secondary | ICD-10-CM

## 2020-08-07 NOTE — Progress Notes (Unsigned)
MFM Note  This patient was seen for a biophysical profile due to advanced maternal age and diet-controlled gestational diabetes.  She reports feeling decreased fetal movements for the last 3 days.    A biophysical profile performed today was 8 out of 8.    There was normal amniotic fluid noted on today's ultrasound exam.  On today's exam, the left ventricle of the fetal heart appears much smaller than the right ventricle increasing the suspicion for coarctation of the aorta.  On the three-vessel view, the aorta appears to be smaller than the pulmonary artery.  The fetal cardiac axis also appeared to be abnormal.  The patient already has a fetal echocardiogram scheduled tomorrow.  I have notified pediatric cardiology regarding today's ultrasound findings and will await their recommendations.  She understands that should a coarctation of the aorta be suspected, that she may have to deliver at an institution where pediatric cardiology/surgery is available.  Fetal kick count instructions were reviewed.  The patient was shown that there were vigorous fetal movements noted on today's exam.   All conversations were held with patient today with the help of a Spanish interpreter.  A total of 15 minutes was spent counseling and coordinating the care for this patient.  Greater than 50% of the time was spent in direct face-to-face contact.

## 2020-08-08 ENCOUNTER — Encounter: Payer: Self-pay | Admitting: Pediatrics

## 2020-08-09 ENCOUNTER — Encounter: Payer: Self-pay | Admitting: Obstetrics and Gynecology

## 2020-08-09 ENCOUNTER — Ambulatory Visit (INDEPENDENT_AMBULATORY_CARE_PROVIDER_SITE_OTHER): Payer: Self-pay | Admitting: Obstetrics and Gynecology

## 2020-08-09 ENCOUNTER — Encounter (HOSPITAL_COMMUNITY): Payer: Self-pay | Admitting: Obstetrics & Gynecology

## 2020-08-09 ENCOUNTER — Other Ambulatory Visit: Payer: Self-pay

## 2020-08-09 ENCOUNTER — Telehealth: Payer: Self-pay | Admitting: *Deleted

## 2020-08-09 ENCOUNTER — Inpatient Hospital Stay (HOSPITAL_COMMUNITY)
Admission: AD | Admit: 2020-08-09 | Discharge: 2020-08-09 | Disposition: A | Discharge status: Home / Self Care | Payer: Self-pay | Attending: Obstetrics & Gynecology | Admitting: Obstetrics & Gynecology

## 2020-08-09 VITALS — BP 105/63 | HR 75 | Wt 161.0 lb

## 2020-08-09 DIAGNOSIS — Z98891 History of uterine scar from previous surgery: Secondary | ICD-10-CM

## 2020-08-09 DIAGNOSIS — E039 Hypothyroidism, unspecified: Secondary | ICD-10-CM

## 2020-08-09 DIAGNOSIS — Z3009 Encounter for other general counseling and advice on contraception: Secondary | ICD-10-CM | POA: Insufficient documentation

## 2020-08-09 DIAGNOSIS — Q251 Coarctation of aorta: Secondary | ICD-10-CM | POA: Insufficient documentation

## 2020-08-09 DIAGNOSIS — O471 False labor at or after 37 completed weeks of gestation: Secondary | ICD-10-CM | POA: Insufficient documentation

## 2020-08-09 DIAGNOSIS — Z3A37 37 weeks gestation of pregnancy: Secondary | ICD-10-CM | POA: Insufficient documentation

## 2020-08-09 DIAGNOSIS — Z3480 Encounter for supervision of other normal pregnancy, unspecified trimester: Secondary | ICD-10-CM

## 2020-08-09 DIAGNOSIS — O24410 Gestational diabetes mellitus in pregnancy, diet controlled: Secondary | ICD-10-CM

## 2020-08-09 DIAGNOSIS — O99283 Endocrine, nutritional and metabolic diseases complicating pregnancy, third trimester: Secondary | ICD-10-CM

## 2020-08-09 DIAGNOSIS — O4402 Placenta previa specified as without hemorrhage, second trimester: Secondary | ICD-10-CM

## 2020-08-09 DIAGNOSIS — O479 False labor, unspecified: Secondary | ICD-10-CM

## 2020-08-09 HISTORY — DX: Gestational diabetes mellitus in pregnancy, unspecified control: O24.419

## 2020-08-09 NOTE — MAU Provider Note (Signed)
    Fetal Tracing:  Baseline: 125 bpm Variability: Moderate  Accelerations: 15x15 Decelerations: None Toco: Irregular pattern   Reactive NST  False labor  DC home   Patrizia Paule, Harolyn Rutherford, NP 08/09/2020 8:44 AM

## 2020-08-09 NOTE — Telephone Encounter (Signed)
Received a voicemail from  a Werner Lean at [facility] stating received a referral to [facility] because was discovered fetus has possible coarctation of the aorta  and needs to deliver at [facility]. States has scheduled C/S 08/21/20 . States they need expedited records asap. States she has been trying to reach someone but put on hold.  States message sent to Dr. Alysia Penna also. States also need alternate way to reach patient. Fax number: [phone]. Phone : [phone]. Would like call back.  Per chart review is [id number] patient. Will forward to that office. Adaley Kiene,RN

## 2020-08-09 NOTE — Patient Instructions (Signed)
Tercer trimestre de embarazo °Third Trimester of Pregnancy °El tercer trimestre de embarazo va desde la semana 28 hasta la semana 40. También se dice que va desde el mes 7 hasta el mes 9. En este trimestre, el bebé en gestación (feto) crece muy rápidamente. Hacia el final del noveno mes, el bebé en gestación mide alrededor de 20 pulgadas (45 cm) de largo. Pesa entre 6 y 10 libras (2,70 y 4,50 kg). °Cambios en el cuerpo durante el tercer trimestre °Su organismo continúa atravesando por muchos cambios durante este período. Los cambios varían y generalmente vuelven a la normalidad después del nacimiento del bebé. °Cambios físicos °Seguirá aumentando de peso. Puede ser que aumente entre 25 y 35 libras (11 y 16 kg) hacia el final del embarazo. Si tiene bajo peso, puede aumentar entre 28 y 40 lb (unos 13 a 18 kg). Si tiene sobrepeso, puede aumentar entre 15 y 25 libras (unos 7 a 11 kg). °Podrán aparecer las primeras estrías en las caderas, el vientre (abdomen) y las mamas. °Las mamas seguirán creciendo y pueden doler. Un líquido amarillo (calostro) puede salir de sus pechos. Esta es la primera leche que usted produce para el bebé. °Tal vez haya cambios en el cabello. °El ombligo puede salir hacia afuera. °Puede observar que se le hinchan más las manos, la cara o los tobillos. °Cambios en la salud °Es posible que tenga acidez estomacal. °Es posible que tenga dificultades para defecar (estreñimiento). °Pueden aparecerle hemorroides. Estas son venas hinchadas en el ano que pueden picar o doler. °Puede comenzar a tener venas hinchadas (várices) en las piernas. °Puede presentar más dolor en la pelvis, la espalda o los muslos. °Puede presentar más hormigueo o entumecimiento en las manos, los brazos y las piernas. La piel de su vientre también puede sentirse entumecida. °Es posible que sienta falta de aire a medida que el útero se agranda. °Otros cambios °Es posible que haga pis (orine) con mayor frecuencia. °Puede tener más  problemas para dormir. °Puede notar que el bebé en gestación “baja” o se mueve más hacia bajo, en el vientre. °Puede notar más secreción proveniente de la vagina. °Puede sentir las articulaciones flojas y puede sentir dolor alrededor del hueso pélvico. °Siga estas instrucciones en su casa: °Medicamentos °Use los medicamentos de venta libre y los recetados solamente como se lo haya indicado el médico. Algunos medicamentos no son seguros durante el embarazo. °Tome vitaminas prenatales que contengan por lo menos 600 microgramos (mcg) de ácido fólico. °Comida y bebida °Consuma comidas saludables que incluyan lo siguiente: °Frutas y verduras frescas. °Cereales integrales. °Buenas fuentes de proteínas, como carne, huevos y tofu. °Productos lácteos con bajo contenido de grasa. °Evite la carne cruda y el jugo, la leche y el queso sin pasteurizar. Estos portan gérmenes que pueden provocar daño tanto a usted como al bebé. °Tome 4 o 5 comidas pequeñas en lugar de 3 comidas abundantes al día. °Es posible que deba tomar medidas para prevenir o tratar los problemas para defecar: °Beber suficiente líquido para mantener el pis (orina) de color amarillo pálido. °Come alimentos ricos en fibra. Entre ellos, frijoles, cereales integrales y frutas y verduras frescas. °Limitar los alimentos con alto contenido de grasa y azúcar. Estos incluyen alimentos fritos o dulces. °Actividad °Haga ejercicios solamente como se lo haya indicado el médico. Interrumpa la actividad física si comienza a tener cólicos en el útero. °Evite levantar pesos excesivos. °No haga ejercicio si hace demasiado calor, hay demasiada humedad o se encuentra en un lugar de mucha altura (  altitud elevada). °Si lo desea, puede continuar teniendo relaciones sexuales, a menos que el médico le indique lo contrario. °Alivio del dolor y del malestar °Haga pausas con frecuencia y descanse con las piernas levantadas (elevadas) si tiene calambres en las piernas o dolor en la parte  baja de la espalda. °Dese baños de asiento con agua tibia para aliviar el dolor o las molestias causadas por las hemorroides. Use una crema para las hemorroides si el médico la autoriza. °Use un sostén que le brinde buen soporte si sus mamas están sensibles. °Si desarrolla venas hinchadas y abultadas en las piernas: °Use medias de compresión según las indicaciones de su médico. °Levante los pies durante 15 minutos, 3 o 4 veces por día. °Limite la sal en sus alimentos. °Seguridad °Hable con el médico antes de recorrer largas distancias. °No se dé baños de inmersión en agua caliente, baños turcos ni saunas. °Use el cinturón de seguridad en todo momento mientras vaya en auto. °Hable con el médico si alguien le está haciendo daño o gritando mucho. °Preparación para la llegada del bebé °Para prepararse para la llegada de su bebé: °Tome clases prenatales. °Visite el hospital y recorra el área de maternidad. °Compre un asiento de seguridad orientado hacia atrás para llevar al bebé en el automóvil. Aprenda cómo instalarlo en el auto. °Prepare la habitación del bebé. Saque todas las almohadas y los animales de peluche de la cuna del bebé. °Instrucciones generales °Evite el contacto con las bandejas sanitarias de los gatos y la tierra que estos animales usan. Estos contienen gérmenes que pueden dañar al bebé y causar la pérdida del bebé ya sea aborto espontáneo o muerte fetal. °No se haga duchas vaginales ni use tampones. No use tampones ni toallas higiénicas perfumadas. °No fume ni consuma ningún producto que contenga nicotina o tabaco. Si necesita ayuda para dejar de fumar, consulte al médico. °No beba alcohol. °No use medicamentos a base de hierbas, drogas ilegales, ni medicamentos que el médico no haya autorizado. Las sustancias químicas de estos productos pueden afectar al bebé. °Cumpla con todas las visitas de seguimiento. Esto es importante. °Dónde buscar más información °American Pregnancy Association (Asociación  Americana del Embarazo): americanpregnancy.org °American College of Obstetricians and Gynecologists (Colegio Estadounidense de Obstetras y Ginecólogos): www.acog.org °Office on Women's Health (Oficina para la Salud de la Mujer): womenshealth.gov/pregnancy °Comuníquese con un médico si: °Tiene fiebre. °Tiene cólicos leves o siente presión en la parte baja del vientre. °Sufre un dolor persistente en el abdomen. °Vomita o hace deposiciones acuosas (diarrea). °Advierte líquido con mal olor que proviene de la vagina. °Siente dolor al orinar o hace orina con mal olor. °Tiene un dolor de cabeza que no desaparece después de tomar analgésicos. °Nota cambios en la visión o ve manchas delante de los ojos. °Solicite ayuda de inmediato si: °Rompe la bolsa. °Tiene contracciones regulares separadas por menos de 5 minutos. °Tiene sangrado o pequeñas pérdidas vaginales. °Tiene cólicos o dolor muy intensos en el vientre. °Tiene dificultad para respirar. °Sientes dolor en el pecho. °Se desmaya. °No ha sentido al bebé moverse durante el tiempo que le indicó el médico. °Tiene dolor, hinchazón o enrojecimiento nuevos en un brazo o una pierna o se produce un aumento de alguno de estos síntomas. °Resumen °El tercer trimestre comprende desde la semana 28 hasta la semana 40 (desde el mes 7 hasta el mes 9). Esta es la época en que el bebé en gestación crece muy rápidamente. °Durante este período, las molestias pueden aumentar a medida que usted sube   de peso y el bebé crece. °Prepárese para la llegada del bebé: asista a las clases prenatales, compre un asiento de seguridad orientado hacia atrás para llevar al bebé en auto y prepare la habitación del bebé. °Solicite ayuda de inmediato si tiene sangrado por la vagina, siente dolor en el pecho y tiene dificultad para respirar, o si no ha sentido al bebé moverse durante el tiempo que le indicó el médico. °Esta información no tiene como fin reemplazar el consejo del médico. Asegúrese de hacerle al  médico cualquier pregunta que tenga. °Document Revised: 08/17/2019 Document Reviewed: 08/17/2019 °Elsevier Patient Education © 2022 Elsevier Inc. ° °

## 2020-08-09 NOTE — Progress Notes (Signed)
Subjective:  Joanne [name] is a 40 y.o. G4P3003 at [redacted]w[redacted]d being seen today for ongoing prenatal care.  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  Patient reports general discomforts of pregnancy.  Contractions: Not present. Vag. Bleeding: None.  Movement: Present. Denies leaking of fluid.   The following portions of the patient's history were reviewed and updated as appropriate: allergies, current medications, past family history, past medical history, past social history, past surgical history and problem list. Problem list updated.  Objective:   Vitals:   08/09/20 1331  BP: 105/63  Pulse: 75  Weight: 161 lb (73 kg)    Fetal Status: Fetal Heart Rate (bpm): 154   Movement: Present     General:  Alert, oriented and cooperative. Patient is in no acute distress.  Skin: Skin is warm and dry. No rash noted.   Cardiovascular: Normal heart rate noted  Respiratory: Normal respiratory effort, no problems with respiration noted  Abdomen: Soft, gravid, appropriate for gestational age. Pain/Pressure: Present     Pelvic:  Cervical exam deferred        Extremities: Normal range of motion.  Edema: Trace  Mental Status: Normal mood and affect. Normal behavior. Normal judgment and thought content.   Urinalysis:      Assessment and Plan:  Pregnancy: G4P3003 at [redacted]w[redacted]d  1. Supervision of other normal pregnancy, antepartum Stable  2. Diet controlled gestational diabetes mellitus (GDM) in third trimester CBG's in goal range  3. Placenta previa antepartum in second trimester Resolved  4. Hypothyroidism affecting pregnancy in third  trimester Stable  5. H/O cesarean section For repeat with BTL  6. Unwanted fertility See above  7. Fetal coarctation of arota Fetal ECHO completed yesterday No report as of today's visit Mother reports delivery at [facility] recommended Spoke with Noralee Space, they will coordinate transfer to [facility] for delivery   Term labor symptoms and general obstetric precautions including but not limited to vaginal bleeding, contractions, leaking of fluid and fetal movement were reviewed in detail with the patient. Please refer to After Visit Summary for other counseling recommendations.  Return in about 1 week (around 08/16/2020) for OB visit, face to face, MD only.   Hermina Staggers, MD

## 2020-08-09 NOTE — Discharge Instructions (Signed)
If contractions become stronger and closer (5-40min), you are leaking or have a big gush of fluid, or if you would have bleeding like a period -go directly to [facility].

## 2020-08-09 NOTE — Progress Notes (Signed)
ROB, c/o cramps, went to MAU this AM had a reactive NST.  BS readings: Fasting 87-96 2 hrs after meals 94-130. CS scheduled for 08/21/2020

## 2020-08-09 NOTE — MAU Note (Signed)
Contractions started last night, every 15-20 min.  Unable to rest. A a little brown d/c at 0500, no bleeding or water leaking.  Pt is for repeat c/s. change to [facility], due to fetal cardiology. Reports +FM

## 2020-08-10 ENCOUNTER — Telehealth (HOSPITAL_COMMUNITY): Payer: Self-pay | Admitting: *Deleted

## 2020-08-10 NOTE — Telephone Encounter (Signed)
Preadmission screen  

## 2020-08-10 NOTE — Pre-Procedure Instructions (Signed)
Interpreter number [phone]

## 2020-08-10 NOTE — Pre-Procedure Instructions (Signed)
Pt states she will be delivering in [place] due to a cardiac issue with the baby.

## 2020-08-10 NOTE — Pre-Procedure Instructions (Signed)
[phone] interpreter number

## 2020-08-10 NOTE — Telephone Encounter (Signed)
Clerical team leader notified!

## 2020-08-15 ENCOUNTER — Inpatient Hospital Stay (HOSPITAL_COMMUNITY)
Admission: AD | Admit: 2020-08-15 | Discharge: 2020-08-15 | Disposition: A | Discharge status: Home / Self Care | Payer: Self-pay | Attending: Obstetrics and Gynecology | Admitting: Obstetrics and Gynecology

## 2020-08-15 ENCOUNTER — Encounter (HOSPITAL_COMMUNITY): Payer: Self-pay | Admitting: Obstetrics and Gynecology

## 2020-08-15 ENCOUNTER — Other Ambulatory Visit: Payer: Self-pay

## 2020-08-15 ENCOUNTER — Ambulatory Visit: Payer: Self-pay | Admitting: *Deleted

## 2020-08-15 ENCOUNTER — Ambulatory Visit: Payer: Self-pay | Attending: Obstetrics

## 2020-08-15 ENCOUNTER — Encounter: Payer: Self-pay | Admitting: Family Medicine

## 2020-08-15 ENCOUNTER — Encounter: Payer: Self-pay | Admitting: *Deleted

## 2020-08-15 VITALS — BP 106/59 | HR 64

## 2020-08-15 DIAGNOSIS — O24410 Gestational diabetes mellitus in pregnancy, diet controlled: Secondary | ICD-10-CM

## 2020-08-15 DIAGNOSIS — O479 False labor, unspecified: Secondary | ICD-10-CM

## 2020-08-15 DIAGNOSIS — O471 False labor at or after 37 completed weeks of gestation: Secondary | ICD-10-CM | POA: Insufficient documentation

## 2020-08-15 DIAGNOSIS — E039 Hypothyroidism, unspecified: Secondary | ICD-10-CM

## 2020-08-15 DIAGNOSIS — O368190 Decreased fetal movements, unspecified trimester, not applicable or unspecified: Secondary | ICD-10-CM

## 2020-08-15 DIAGNOSIS — O09523 Supervision of elderly multigravida, third trimester: Secondary | ICD-10-CM

## 2020-08-15 DIAGNOSIS — Z3A38 38 weeks gestation of pregnancy: Secondary | ICD-10-CM | POA: Insufficient documentation

## 2020-08-15 DIAGNOSIS — O99283 Endocrine, nutritional and metabolic diseases complicating pregnancy, third trimester: Secondary | ICD-10-CM

## 2020-08-15 DIAGNOSIS — O34219 Maternal care for unspecified type scar from previous cesarean delivery: Secondary | ICD-10-CM

## 2020-08-15 NOTE — MAU Note (Signed)
..  Joanne [name] is a 40 y.o. at [redacted]w[redacted]d here in MAU reporting: Contractions every 10 minutes. Denies leaking of fluid or vaginal bleeding. +FM.  Reports that she has a scheduled c-section in [place] and is here to see if she is dilated to know if she needs to head to [place] or not.  Onset of complaint: yesterday  Pain score: 3/10 Vitals:   08/15/20 0501  BP: 118/66  Pulse: (!) 59  Resp: 17  Temp: (!) 97.5 F (36.4 C)  SpO2: 99%

## 2020-08-15 NOTE — MAU Provider Note (Signed)
S: Ms. Joanne [name] is a 40 y.o. [phone] at [redacted]w[redacted]d  who presents to MAU today for labor evaluation.     Cervical exam by RN:  Dilation: 1 Effacement (%): 50 Station: Ballotable Presentation: Vertex Exam by:: Manuela Neptune, RN  Fetal Monitoring: Baseline: 130 Variability: moderate Accelerations: multiple Decelerations: none Contractions: irregular  MDM Discussed patient with RN. NST reviewed and reactive.   A: [facility] at [redacted]w[redacted]d  False labor  P: Discharge home; pt has scheduled Cesarean at [facility] on 08/23/20 given suspected coarctation on fetal echo. Counseled pt of need to present to [facility] if concern of labor, vaginal bleeding, leakage of fluid, decreased fetal movement or other concerns prior to scheduled Cesarean for safety of baby.  Sheila Oats, MD 08/15/2020 6:42 AM

## 2020-08-17 ENCOUNTER — Other Ambulatory Visit (HOSPITAL_COMMUNITY): Payer: Self-pay | Attending: Obstetrics and Gynecology

## 2020-08-20 ENCOUNTER — Encounter (HOSPITAL_COMMUNITY): Payer: Self-pay | Admitting: Family Medicine

## 2020-08-21 ENCOUNTER — Encounter (HOSPITAL_COMMUNITY): Admission: RE | Payer: Self-pay | Source: Home / Self Care

## 2020-08-21 ENCOUNTER — Encounter (HOSPITAL_COMMUNITY): Payer: Self-pay | Admitting: Family Medicine

## 2020-08-21 ENCOUNTER — Inpatient Hospital Stay (HOSPITAL_COMMUNITY): Admission: RE | Admit: 2020-08-21 | Payer: Self-pay | Source: Home / Self Care | Admitting: Family Medicine

## 2020-08-21 SURGERY — Surgical Case
Anesthesia: Regional

## 2020-08-28 ENCOUNTER — Telehealth: Payer: Self-pay

## 2020-08-28 NOTE — Telephone Encounter (Signed)
PT DELIVERED 08/21/2020.

## 2020-11-09 IMAGING — DX DG ABD PORTABLE 1V
1 series · 1 of 1 positions shown · non-contrast
Comparison: None.

CLINICAL DATA: Emergency C-section.  No time for instrument count

EXAM:
PORTABLE ABDOMEN - 1 VIEW

[abdomen]
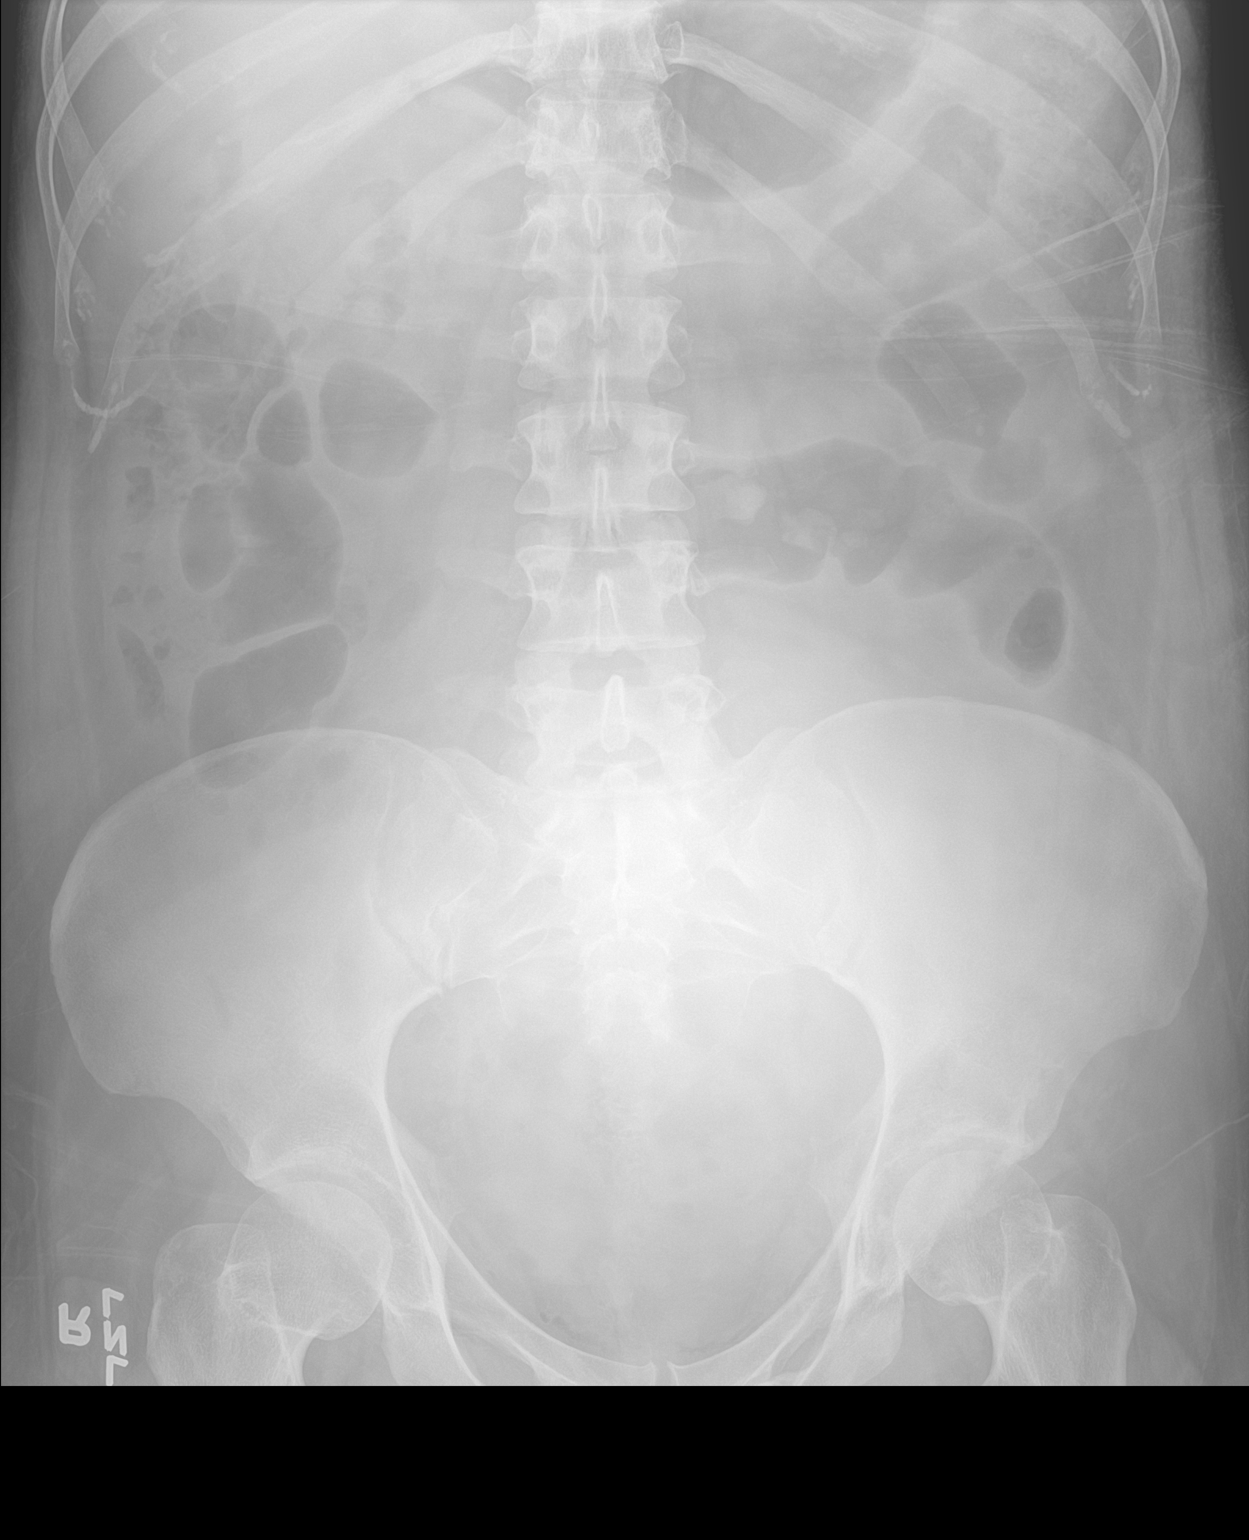

[1 of 1 positions shown; findings below may reference images not displayed]

FINDINGS: No unexpected opaque foreign body. Hazy density in the pelvis with
paucity of gas, correlating with recently gravid uterus. Expected
extraperitoneal gas in the low pelvis. Findings called to Dr. Cleophas Reverien
at [DATE] a.m.
IMPRESSION: No unexpected foreign body.

## 2021-12-21 ENCOUNTER — Emergency Department (HOSPITAL_COMMUNITY)
Admission: EM | Admit: 2021-12-21 | Discharge: 2021-12-22 | Disposition: A | Discharge status: Home / Self Care | Payer: Self-pay | Attending: Emergency Medicine | Admitting: Emergency Medicine

## 2021-12-21 ENCOUNTER — Emergency Department (HOSPITAL_COMMUNITY): Payer: Self-pay

## 2021-12-21 ENCOUNTER — Encounter (HOSPITAL_COMMUNITY): Payer: Self-pay | Admitting: *Deleted

## 2021-12-21 ENCOUNTER — Other Ambulatory Visit: Payer: Self-pay

## 2021-12-21 DIAGNOSIS — Y92410 Unspecified street and highway as the place of occurrence of the external cause: Secondary | ICD-10-CM | POA: Diagnosis not present

## 2021-12-21 DIAGNOSIS — R519 Headache, unspecified: Secondary | ICD-10-CM | POA: Insufficient documentation

## 2021-12-21 DIAGNOSIS — S301XXA Contusion of abdominal wall, initial encounter: Secondary | ICD-10-CM | POA: Insufficient documentation

## 2021-12-21 DIAGNOSIS — S0081XA Abrasion of other part of head, initial encounter: Secondary | ICD-10-CM | POA: Insufficient documentation

## 2021-12-21 DIAGNOSIS — R1010 Upper abdominal pain, unspecified: Secondary | ICD-10-CM | POA: Diagnosis present

## 2021-12-21 DIAGNOSIS — M542 Cervicalgia: Secondary | ICD-10-CM | POA: Insufficient documentation

## 2021-12-21 LAB — CBC WITH DIFFERENTIAL/PLATELET
Abs Immature Granulocytes: 0.02 10*3/uL (ref 0.00–0.07)
Basophils Absolute: 0.1 10*3/uL (ref 0.0–0.1)
Basophils Relative: 1 %
Eosinophils Absolute: 0.1 10*3/uL (ref 0.0–0.5)
Eosinophils Relative: 1 %
HCT: 39.6 % (ref 36.0–46.0)
Hemoglobin: 13.2 g/dL (ref 12.0–15.0)
Immature Granulocytes: 0 %
Lymphocytes Relative: 26 %
Lymphs Abs: 2 10*3/uL (ref 0.7–4.0)
MCH: 28.3 pg (ref 26.0–34.0)
MCHC: 33.3 g/dL (ref 30.0–36.0)
MCV: 85 fL (ref 80.0–100.0)
Monocytes Absolute: 0.3 10*3/uL (ref 0.1–1.0)
Monocytes Relative: 4 %
Neutro Abs: 5.3 10*3/uL (ref 1.7–7.7)
Neutrophils Relative %: 68 %
Platelets: 333 10*3/uL (ref 150–400)
RBC: 4.66 MIL/uL (ref 3.87–5.11)
RDW: 14.6 % (ref 11.5–15.5)
WBC: 7.7 10*3/uL (ref 4.0–10.5)
nRBC: 0 % (ref 0.0–0.2)

## 2021-12-21 LAB — COMPREHENSIVE METABOLIC PANEL
ALT: 32 U/L (ref 0–44)
AST: 24 U/L (ref 15–41)
Albumin: 4.3 g/dL (ref 3.5–5.0)
Alkaline Phosphatase: 63 U/L (ref 38–126)
Anion gap: 10 (ref 5–15)
BUN: 13 mg/dL (ref 6–20)
CO2: 23 mmol/L (ref 22–32)
Calcium: 9.9 mg/dL (ref 8.9–10.3)
Chloride: 105 mmol/L (ref 98–111)
Creatinine, Ser: 0.8 mg/dL (ref 0.44–1.00)
GFR, Estimated: >60 mL/min (ref >60)
Glucose, Bld: 88 mg/dL (ref 70–99)
Potassium: 3.7 mmol/L (ref 3.5–5.1)
Sodium: 138 mmol/L (ref 135–145)
Total Bilirubin: 0.5 mg/dL (ref 0.3–1.2)
Total Protein: 7.7 g/dL (ref 6.5–8.1)

## 2021-12-21 LAB — I-STAT BETA HCG BLOOD, ED (MC, WL, AP ONLY): I-stat hCG, quantitative: <5 m[IU]/mL (ref <5)
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
## 2021-12-22 ENCOUNTER — Emergency Department (HOSPITAL_COMMUNITY): Payer: Self-pay

## 2021-12-22 MED ORDER — IOHEXOL 350 MG/ML SOLN
75.0000 mL | Freq: Once | INTRAVENOUS | Status: AC | PRN
  Administered 2021-12-22: 75 mL via INTRAVENOUS
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
